# Patient Record
Sex: Female | Born: 1960 | Race: Black or African American | Hispanic: No | Marital: Single | Smoking: Never smoker
Health system: Southern US, Community
[De-identification: ages and names within clinical notes are randomized; demographics above are authoritative.]

## PROBLEM LIST (undated history)

## (undated) ENCOUNTER — Emergency Department (HOSPITAL_COMMUNITY): Admission: EM | Discharge status: Absent without leave | Payer: 59

## (undated) ENCOUNTER — Emergency Department (HOSPITAL_COMMUNITY): Admission: EM | Payer: 59 | Source: Home / Self Care

## (undated) ENCOUNTER — Emergency Department (HOSPITAL_COMMUNITY): Admission: EM | Discharge status: Against Medical Advice | Payer: Self-pay

## (undated) DIAGNOSIS — IMO0001 Reserved for inherently not codable concepts without codable children: Secondary | ICD-10-CM

## (undated) DIAGNOSIS — E785 Hyperlipidemia, unspecified: Secondary | ICD-10-CM

## (undated) DIAGNOSIS — I4891 Unspecified atrial fibrillation: Secondary | ICD-10-CM

## (undated) DIAGNOSIS — M1990 Unspecified osteoarthritis, unspecified site: Secondary | ICD-10-CM

## (undated) DIAGNOSIS — R0602 Shortness of breath: Secondary | ICD-10-CM

## (undated) DIAGNOSIS — R896 Abnormal cytological findings in specimens from other organs, systems and tissues: Secondary | ICD-10-CM

## (undated) DIAGNOSIS — R079 Chest pain, unspecified: Secondary | ICD-10-CM

## (undated) DIAGNOSIS — K635 Polyp of colon: Secondary | ICD-10-CM

## (undated) DIAGNOSIS — I10 Essential (primary) hypertension: Secondary | ICD-10-CM

## (undated) HISTORY — DX: Unspecified atrial fibrillation: I48.91

## (undated) HISTORY — DX: Reserved for inherently not codable concepts without codable children: IMO0001

## (undated) HISTORY — PX: TONSILLECTOMY: SUR1361

## (undated) HISTORY — DX: Polyp of colon: K63.5

## (undated) HISTORY — PX: TUBAL LIGATION: SHX77

## (undated) HISTORY — DX: Abnormal cytological findings in specimens from other organs, systems and tissues: R89.6

## (undated) HISTORY — DX: Hyperlipidemia, unspecified: E78.5

---

## 1998-04-09 ENCOUNTER — Other Ambulatory Visit: Admission: RE | Admit: 1998-04-09 | Discharge: 1998-04-09 | Payer: Self-pay | Admitting: Gynecology

## 1998-05-13 ENCOUNTER — Emergency Department (HOSPITAL_COMMUNITY): Admission: EM | Admit: 1998-05-13 | Discharge: 1998-05-13 | Payer: Self-pay | Admitting: Emergency Medicine

## 1998-05-30 ENCOUNTER — Ambulatory Visit (HOSPITAL_BASED_OUTPATIENT_CLINIC_OR_DEPARTMENT_OTHER): Admission: RE | Admit: 1998-05-30 | Discharge: 1998-05-30 | Payer: Self-pay | Admitting: General Surgery

## 1998-09-09 ENCOUNTER — Other Ambulatory Visit: Admission: RE | Admit: 1998-09-09 | Discharge: 1998-09-09 | Payer: Self-pay | Admitting: Gynecology

## 1999-03-06 ENCOUNTER — Other Ambulatory Visit: Admission: RE | Admit: 1999-03-06 | Discharge: 1999-03-06 | Payer: Self-pay | Admitting: Gynecology

## 1999-08-25 ENCOUNTER — Other Ambulatory Visit: Admission: RE | Admit: 1999-08-25 | Discharge: 1999-08-25 | Payer: Self-pay | Admitting: Gynecology

## 2000-02-16 ENCOUNTER — Other Ambulatory Visit: Admission: RE | Admit: 2000-02-16 | Discharge: 2000-02-16 | Payer: Self-pay | Admitting: Gynecology

## 2000-02-18 ENCOUNTER — Encounter: Admission: RE | Admit: 2000-02-18 | Discharge: 2000-02-18 | Payer: Self-pay | Admitting: Cardiology

## 2000-02-18 ENCOUNTER — Encounter: Payer: Self-pay | Admitting: Cardiology

## 2000-05-09 ENCOUNTER — Other Ambulatory Visit: Admission: RE | Admit: 2000-05-09 | Discharge: 2000-05-09 | Payer: Self-pay | Admitting: Gynecology

## 2000-08-23 ENCOUNTER — Encounter: Payer: Self-pay | Admitting: Cardiology

## 2000-08-23 ENCOUNTER — Encounter: Admission: RE | Admit: 2000-08-23 | Discharge: 2000-08-23 | Payer: Self-pay | Admitting: Cardiology

## 2001-08-10 ENCOUNTER — Ambulatory Visit (HOSPITAL_COMMUNITY): Admission: RE | Admit: 2001-08-10 | Discharge: 2001-08-10 | Payer: Self-pay | Admitting: Cardiology

## 2001-08-10 ENCOUNTER — Encounter: Payer: Self-pay | Admitting: Cardiology

## 2001-11-08 ENCOUNTER — Other Ambulatory Visit: Admission: RE | Admit: 2001-11-08 | Discharge: 2001-11-08 | Payer: Self-pay | Admitting: Gynecology

## 2001-11-15 ENCOUNTER — Ambulatory Visit (HOSPITAL_COMMUNITY): Admission: RE | Admit: 2001-11-15 | Discharge: 2001-11-15 | Payer: Self-pay | Admitting: Cardiology

## 2001-11-21 ENCOUNTER — Ambulatory Visit (HOSPITAL_COMMUNITY): Admission: RE | Admit: 2001-11-21 | Discharge: 2001-11-21 | Payer: Self-pay | Admitting: Cardiology

## 2002-11-01 ENCOUNTER — Other Ambulatory Visit: Admission: RE | Admit: 2002-11-01 | Discharge: 2002-11-01 | Payer: Self-pay | Admitting: Gynecology

## 2003-12-26 ENCOUNTER — Ambulatory Visit (HOSPITAL_BASED_OUTPATIENT_CLINIC_OR_DEPARTMENT_OTHER): Admission: RE | Admit: 2003-12-26 | Discharge: 2003-12-26 | Payer: Self-pay | Admitting: Orthopedic Surgery

## 2003-12-26 ENCOUNTER — Encounter (INDEPENDENT_AMBULATORY_CARE_PROVIDER_SITE_OTHER): Payer: Self-pay | Admitting: *Deleted

## 2003-12-26 ENCOUNTER — Ambulatory Visit (HOSPITAL_COMMUNITY): Admission: RE | Admit: 2003-12-26 | Discharge: 2003-12-26 | Payer: Self-pay | Admitting: Orthopedic Surgery

## 2004-02-03 ENCOUNTER — Other Ambulatory Visit: Admission: RE | Admit: 2004-02-03 | Discharge: 2004-02-03 | Payer: Self-pay | Admitting: Gynecology

## 2004-05-15 ENCOUNTER — Other Ambulatory Visit: Admission: RE | Admit: 2004-05-15 | Discharge: 2004-05-15 | Payer: Self-pay | Admitting: Gynecology

## 2004-09-10 ENCOUNTER — Observation Stay (HOSPITAL_COMMUNITY): Admission: RE | Admit: 2004-09-10 | Discharge: 2004-09-11 | Payer: Self-pay | Admitting: Orthopedic Surgery

## 2005-08-03 ENCOUNTER — Other Ambulatory Visit: Admission: RE | Admit: 2005-08-03 | Discharge: 2005-08-03 | Payer: Self-pay | Admitting: Gynecology

## 2005-08-27 ENCOUNTER — Emergency Department (HOSPITAL_COMMUNITY): Admission: EM | Admit: 2005-08-27 | Discharge: 2005-08-28 | Payer: Self-pay | Admitting: Family Medicine

## 2006-10-14 ENCOUNTER — Other Ambulatory Visit: Admission: RE | Admit: 2006-10-14 | Discharge: 2006-10-14 | Payer: Self-pay | Admitting: Gynecology

## 2007-05-11 ENCOUNTER — Other Ambulatory Visit: Admission: RE | Admit: 2007-05-11 | Discharge: 2007-05-11 | Payer: Self-pay | Admitting: Gynecology

## 2007-10-09 ENCOUNTER — Emergency Department (HOSPITAL_COMMUNITY): Admission: EM | Admit: 2007-10-09 | Discharge: 2007-10-09 | Payer: Self-pay | Admitting: Emergency Medicine

## 2007-11-06 ENCOUNTER — Ambulatory Visit (HOSPITAL_COMMUNITY): Admission: RE | Admit: 2007-11-06 | Discharge: 2007-11-06 | Payer: Self-pay | Admitting: Cardiology

## 2007-11-19 HISTORY — PX: CARDIAC CATHETERIZATION: SHX172

## 2007-11-23 ENCOUNTER — Inpatient Hospital Stay (HOSPITAL_BASED_OUTPATIENT_CLINIC_OR_DEPARTMENT_OTHER): Admission: RE | Admit: 2007-11-23 | Discharge: 2007-11-23 | Payer: Self-pay | Admitting: Cardiology

## 2007-12-13 ENCOUNTER — Other Ambulatory Visit: Admission: RE | Admit: 2007-12-13 | Discharge: 2007-12-13 | Payer: Self-pay | Admitting: Gynecology

## 2008-01-23 ENCOUNTER — Encounter: Admission: RE | Admit: 2008-01-23 | Discharge: 2008-01-23 | Payer: Self-pay | Admitting: Orthopedic Surgery

## 2008-03-04 ENCOUNTER — Encounter (INDEPENDENT_AMBULATORY_CARE_PROVIDER_SITE_OTHER): Payer: Self-pay | Admitting: Specialist

## 2008-03-04 ENCOUNTER — Ambulatory Visit (HOSPITAL_BASED_OUTPATIENT_CLINIC_OR_DEPARTMENT_OTHER): Admission: RE | Admit: 2008-03-04 | Discharge: 2008-03-04 | Payer: Self-pay | Admitting: Specialist

## 2009-01-06 ENCOUNTER — Other Ambulatory Visit: Admission: RE | Admit: 2009-01-06 | Discharge: 2009-01-06 | Payer: Self-pay | Admitting: Gynecology

## 2009-01-06 ENCOUNTER — Ambulatory Visit: Payer: Self-pay | Admitting: Gynecology

## 2009-01-06 ENCOUNTER — Encounter: Payer: Self-pay | Admitting: Gynecology

## 2009-07-08 ENCOUNTER — Other Ambulatory Visit: Admission: RE | Admit: 2009-07-08 | Discharge: 2009-07-08 | Payer: Self-pay | Admitting: Gynecology

## 2009-07-08 ENCOUNTER — Encounter: Payer: Self-pay | Admitting: Gynecology

## 2009-07-08 ENCOUNTER — Ambulatory Visit: Payer: Self-pay | Admitting: Gynecology

## 2009-07-29 ENCOUNTER — Emergency Department (HOSPITAL_COMMUNITY): Admission: EM | Admit: 2009-07-29 | Discharge: 2009-07-29 | Payer: Self-pay | Admitting: Emergency Medicine

## 2009-07-30 ENCOUNTER — Emergency Department (HOSPITAL_COMMUNITY): Admission: EM | Admit: 2009-07-30 | Discharge: 2009-07-31 | Payer: Self-pay | Admitting: Emergency Medicine

## 2009-07-31 ENCOUNTER — Inpatient Hospital Stay (HOSPITAL_COMMUNITY): Admission: AD | Admit: 2009-07-31 | Discharge: 2009-08-03 | Payer: Self-pay | Admitting: Psychiatry

## 2009-07-31 ENCOUNTER — Ambulatory Visit: Payer: Self-pay | Admitting: Psychiatry

## 2010-10-09 ENCOUNTER — Ambulatory Visit
Admission: RE | Admit: 2010-10-09 | Discharge: 2010-10-09 | Payer: Self-pay | Source: Home / Self Care | Attending: Gynecology | Admitting: Gynecology

## 2010-10-09 ENCOUNTER — Other Ambulatory Visit: Payer: Self-pay | Admitting: Gynecology

## 2010-10-09 ENCOUNTER — Other Ambulatory Visit
Admission: RE | Admit: 2010-10-09 | Discharge: 2010-10-09 | Payer: Self-pay | Source: Home / Self Care | Admitting: Gynecology

## 2010-10-15 ENCOUNTER — Ambulatory Visit
Admission: RE | Admit: 2010-10-15 | Discharge: 2010-10-15 | Payer: Self-pay | Source: Home / Self Care | Attending: Gynecology | Admitting: Gynecology

## 2010-12-23 LAB — URINALYSIS, ROUTINE W REFLEX MICROSCOPIC
Glucose, UA: NEGATIVE mg/dL
Hgb urine dipstick: NEGATIVE
Ketones, ur: NEGATIVE mg/dL
Protein, ur: NEGATIVE mg/dL
pH: 5.5 (ref 5.0–8.0)

## 2010-12-23 LAB — RAPID URINE DRUG SCREEN, HOSP PERFORMED
Amphetamines: NOT DETECTED
Benzodiazepines: POSITIVE — AB
Opiates: NOT DETECTED
Tetrahydrocannabinol: NOT DETECTED

## 2010-12-23 LAB — POCT I-STAT, CHEM 8
BUN: <3 mg/dL — ABNORMAL LOW (ref 6–23)
BUN: <3 mg/dL — ABNORMAL LOW (ref 6–23)
Calcium, Ion: 1.13 mmol/L (ref 1.12–1.32)
Calcium, Ion: 1.21 mmol/L (ref 1.12–1.32)
Chloride: 105 mEq/L (ref 96–112)
Creatinine, Ser: 0.5 mg/dL (ref 0.4–1.2)
HCT: 37 % (ref 36.0–46.0)
Hemoglobin: 12.6 g/dL (ref 12.0–15.0)
Sodium: 141 mEq/L (ref 135–145)
TCO2: 24 mmol/L (ref 0–100)
TCO2: 28 mmol/L (ref 0–100)

## 2010-12-23 LAB — DIFFERENTIAL
Basophils Absolute: 0 10*3/uL (ref 0.0–0.1)
Basophils Relative: 1 % (ref 0–1)
Eosinophils Absolute: 0 10*3/uL (ref 0.0–0.7)
Eosinophils Relative: 1 % (ref 0–5)
Neutrophils Relative %: 70 % (ref 43–77)

## 2010-12-23 LAB — CBC
HCT: 35.2 % — ABNORMAL LOW (ref 36.0–46.0)
MCHC: 33.5 g/dL (ref 30.0–36.0)
MCV: 90.6 fL (ref 78.0–100.0)
Platelets: 385 10*3/uL (ref 150–400)
RDW: 15.1 % (ref 11.5–15.5)

## 2010-12-23 LAB — ETHANOL

## 2010-12-23 LAB — POCT PREGNANCY, URINE

## 2011-02-02 NOTE — Cardiovascular Report (Signed)
NAMEPETRA, [name]                ACCOUNT NO.:  [id number]   MEDICAL RECORD NO.:  [id number]          PATIENT TYPE:  OIB   LOCATION:  NA                           FACILITY:  [facility]   PHYSICIAN:  Mohan N. Sharyn Lull, M.D. DATE OF BIRTH:  07-22-1961   DATE OF PROCEDURE:  11/23/2007  DATE OF DISCHARGE:                            CARDIAC CATHETERIZATION   PROCEDURE:  Left cardiac catheterization with selective left and right  coronary angiography, left ventriculography via right groin using  Judkins technique.   INDICATIONS FOR PROCEDURE:  Elizabeth [name] is a 50 year old black female  with past medical history significant for hypertension, history of  paroxysmal A fib with rapid ventricular response.  She complains of  retrosternal chest pain described as heaviness, grade 3/10, associated  with feeling tired and weak, also complains of palpitation off and on  and was noted to be in A fib with RPR and spontaneously converted to  regular normal sinus rhythm.  The patient underwent Persantine Myoview  on November 05, 2006 which showed anterior wall ischemia with EF of 49%.  EKG done in my office showed normal sinus rhythm with ST-T wave changes  in the anterolateral and inferior leads.  The patient is referred for  left cath.   PAST MEDICAL HISTORY:  As above.   PAST SURGICAL HISTORY:  1. She had ganglion cyst resection in the past.  2. Bunionectomy in the past.   SOCIAL HISTORY:  She is single and has 3 children.  No history of  smoking.  Drinks wine socially.  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   FAMILY HISTORY:  Father died of MI at the age of 65; he had a CABG at  the age of 47.  Mother died of MI also had the age of 78; she also had  CA of jejunum.  One brother had MI at the age of 45; he is hypertensive  and diabetic.   MEDICATION AT HOME:  She is on:  1. Enteric-coated aspirin 81 mg p.o. daily.  2. Toprol-XL 50 mg p.o. daily.   ALLERGIES:  NO KNOWN DRUG  ALLERGIES.   EXAMINATION:  She was alert, awake and oriented x3 in no acute distress.  Blood pressure was 140/90.  Pulse was 88.  Conjunctivae were pink.  NECK:  Supple.  No JVD, no bruit.  LUNGS:  Clear to auscultation without  rhonchi or rales.  CARDIOVASCULAR:  S1-S2 were normal.  There was a soft  systolic murmur.  There was no S3 gallop.  ABDOMEN:  Soft.  Bowel sounds  present.  Nontender.  EXTREMITIES:  There was no clubbing, cyanosis or  edema.   IMPRESSION:  1. Chest pain, positive Persantine Myoview, rule out coronary      insufficiency.  2. Uncontrolled hypertension.  3. History of paroxysmal atrial fibrillation with rapid ventricular      response.   PLAN:  Lopressor was increased to 50 mg p.o. b.i.d., enteric-coated  aspirin 81 mg was added and Nitrolingual spray p.r.n. I discussed with  the patient regarding left cath, its risks and benefits,  i.e. death, MI,  stroke, local vascular complications, etc, and consented for the  procedure.   PROCEDURE:  After obtaining the informed consent, the patient was  brought to the cath lab and was placed on fluoroscopic table.  The right  groin was prepped and draped in the usual fashion.  Xylocaine 1% was  used for local anesthesia in the right groin.  With the help of a thin-  wall needle, a 4-French arterial sheath was placed.  The sheath was  aspirated and flushed.  Next, a 4-French left Judkins catheter was  advanced over the wire under fluoroscopic guidance up to the ascending  aorta.  Wire was pulled out and the catheter was aspirated and connected  to the manifold.  Catheter was further advanced and engaged into left  coronary ostium.  Multiple views of the left system were taken.  Next,  the catheter was disengaged and was pulled out over the wire and was  replaced with 4-French 3-D right diagnostic catheter, which was advanced  over the wire under fluoroscopic guidance up to the ascending aorta.  Wire was pulled out.  The  catheter was aspirated and connected to the  manifold.  Next, this catheter was attempted to engage into right  coronary ostium without success.  This catheter was replaced with right  Judkins catheter, which also could not be engaged into right coronary  ostium.  Next, right Judkins catheter was pulled out over the wire and  was replaced with AL-1 4-French diagnostic catheter, which was advanced  over the wire under fluoroscopic guidance up the ascending aorta.  Wire  was pulled out.  The catheter was aspirated and connected to the  manifold.  Catheter was further advanced and engaged into right coronary  ostium.  Multiple views of the right system were taken.  Next, the  catheter was disengaged and was pulled out over the wire and was  replaced with a 4-French pigtail catheter, which was advanced over the  wire under fluoroscopic guidance up to the ascending aorta.  Wire was  pulled out and the catheter was aspirated and connected to the manifold.  The catheter was further advanced across aortic valve into the LV.  LV  pressures were recorded.  Next, left ventriculography was done in 30-  degree RAO position.  Post angiographic pressures were recorded from LV  and then pullback pressures were recorded from the aorta.  There was no  gradient across the aortic valve.  Next, the pigtail catheter was pulled  out over the wire.  Sheaths were aspirated and flushed.   FINDINGS:  LV showed good LV systolic function, EF of 55% to 60%.  Left  main was patent.  LAD has 10% to 15% proximal stenosis and 15% to 20%  mid stenosis.  Diagonal #1 and #2 were very small, which were patent.  Ramus was very, very small, which was patent.  Left circumflex was  patent.  OM-1 was very, very small.  OM-2 and OM-3 were very, very  small, which were patent.  RCA was arising from the left coronary cusp  and is patent.  The patient tolerated the procedure well.  There were no  complications.  The patient was  transferred to recovery room in stable  condition.      Elizabeth [name]. Sharyn Lull, M.D.  Electronically Signed     MNH/MEDQ  D:  11/23/2007  T:  11/23/2007  Job:  [id number]   cc:   [facility]  Kevin Fenton  Leeann Must, M.D.

## 2011-02-02 NOTE — Op Note (Signed)
Elizabeth [name], Elizabeth [name]                ACCOUNT NO.:  [id number]   MEDICAL RECORD NO.:  [id number]          PATIENT TYPE:  AMB   LOCATION:  DSC                          FACILITY:  [facility]   PHYSICIAN:  Earvin Hansen L. Shon Hough, M.D.DATE OF BIRTH:  03-26-1961   DATE OF PROCEDURE:  03/04/2008  DATE OF DISCHARGE:                               OPERATIVE REPORT   INDICATIONS:  A 50 year old lady with an expanding mass about her left  lateral anterior thigh.  It measured approximately 6 x 8 inches with  increase growth.   PROCEDURES DONE:  Excision of the area deep, excision with liposuction  assistance.   ANESTHESIA:  General.   DESCRIPTION OF PROCEDURE:  The patient underwent general anesthesia,  after a drawing was made on the mass while the patient was standing,  prep was done to the left thigh and leg with Hibiclens solution and  soap, walled off with sterile towels and drapes so as to make a sterile  field.  A 0.4% Xylocaine with epinephrine 1:400,000 concentration was  injected for vasoconstriction with 100 mL.  Next, small incision was  made and liposuction was fashioned over the mass and it seemed to be  deeped out to underlying fascia.  We have to dissect under the area with  removal of 250 mL of material, which had consistency of lipoma.  This  mass was sent as specimen off; however, they ruled out any liposarcoma  of the mass effects.  Prior to hemostasis, sterile wraps were applied, 4  x 4s, ABDs, Kerlix, and ace wraps.  She tolerated the procedures very  well and was taken to recovery room in good condition.      Elizabeth [name]. Shon Hough, M.D.  Electronically Signed     GLT/MEDQ  D:  03/04/2008  T:  03/05/2008  Job:  [id number]

## 2011-02-05 NOTE — Op Note (Signed)
Elizabeth [name], Elizabeth [name]                ACCOUNT NO.:  [id number]   MEDICAL RECORD NO.:  [id number]          PATIENT TYPE:  AMB   LOCATION:  DAY                          FACILITY:  [facility]   PHYSICIAN:  Marlowe Kays, M.D.  DATE OF BIRTH:  04/01/1961   DATE OF PROCEDURE:  09/10/2004  DATE OF DISCHARGE:                                 OPERATIVE REPORT   PREOPERATIVE DIAGNOSIS:  Bilateral painful bunions.   POSTOPERATIVE DIAGNOSIS:  Bilateral painful bunions.   OPERATION:  Bilateral simple bunionectomies.   SURGEON:  Marlowe Kays, M.D.   ASSISTANT:  Nurse.   ANESTHESIA:  General.   INDICATIONS FOR PROCEDURE:  She has painful bunions bilaterally with a 10  degree first and second metatarsal angle.  On the dorsum of the right first  metatarsal head, she had some large spurs in addition.   PROCEDURE:  Satisfactory general anesthesia.  Bilateral pneumatic  tourniquets.  Legs were esmarched out nonsterilely and prepped from the calf  to the toes with Duraprep.  Draped in a sterile field.  On the left foot, I  made a dorsomedial incision, protecting the dorsal sensory nerve.  The  capsule was identified and opened with a flap-based distally technique.  The  dorsum and first metatarsal head did not have any spurs.  I did a generous  bunionectomy by making a cut at the base with a 3/4 inch curved osteotome  and then going retrograde, smoothing out  the rough edges with the rongeur.  I felt the bunionectomy had been completed.  I irrigated the wound well,  infiltrated the wound edges with 0.5% plain Marcaine and with the great toe  in correct position, reattached the flap under tension with interrupted 0  Vicryl and the skin and subcutaneous tissue with interrupted 4-0 nylon mast  sutures.  Betadine and Adaptic dry sterile dressing was applied.  The  tourniquet was released.  We then did exactly the same procedure on the  right foot, except that I removed the dorsal spurs with a small  Baer  rongeur.  The tourniquet was released after completion of the right foot on  the right lower extremity.  She tolerated the procedure well, and at the  time of this dictation was on her way to the recovery room in satisfactory  with no known complications.      JA/MEDQ  D:  09/10/2004  T:  09/10/2004  Job:  [id number]

## 2011-02-05 NOTE — Op Note (Signed)
Elizabeth [name], Elizabeth [name]                          ACCOUNT NO.:  [id number]   MEDICAL RECORD NO.:  [id number]                   PATIENT TYPE:  AMB   LOCATION:  DSC                                  FACILITY:  [facility]   PHYSICIAN:  Marlowe Kays, M.D.               DATE OF BIRTH:  05/04/1961   DATE OF PROCEDURE:  12/26/2003  DATE OF DISCHARGE:                                 OPERATIVE REPORT   PREOPERATIVE DIAGNOSIS:  Symptomatic ganglion, dorsum of right wrist.   POSTOPERATIVE DIAGNOSIS:  Symptomatic ganglion, dorsum of right wrist.   OPERATION:  Excision of ganglion, dorsum of right wrist.   SURGEON:  Marlowe Kays, M.D.   ASSISTANT:  Nurse.   ANESTHESIA:  IV regional.   PATHOLOGY AND JUSTIFICATION FOR PROCEDURE:  The mass has been present for  some four months.  She is having weakness in her hand because of this and  pain going up the arm.  On exam this ganglion measures a good 2 cm in  diameter, is in the mid-dorsum of her right wrist.  X-rays have been normal.   PROCEDURE:  Satisfactory IV regional anesthesia, Duraprep from midforearm to  fingertips, was draped in a sterile field.  A transverse incision was made  and going through the skin and subcutaneous tissue, the large ganglion was  noted.  The thin extensor retinaculum on top of it was opened in line with  the extensor tendons and the mass demarcated.  The extensor tendons were  protected, and I separated out the mass from these tendons, going down to  the capsule of the wrist joint.  This encompassed a large section of the  capsule.  I did excise it in toto.  Potential bleeders were coagulated with  bipolar cautery.  I then tried to repair the defect in the dorsum of the  wrist capsule but was unable to completely close it.  There was sufficient  tissue distally but not enough proximally.  Consequently, I performed a  partial closure and covered the defect with Gelfoam.  With the wrist in  slight dorsiflexion, I then  closed the extensor retinaculum with interrupted  3-0 Vicryl, which I had used for the partial capsule closure as well.  I  also closed the subcutaneous tissue with the same and the skin with running  4-0 nylon.  Betadine, Adaptic dry sterile dressing, volar plaster splint  were applied.  The tourniquet was released.  At the time of this dictation  she was on her way to the recovery room in satisfactory condition with no  known complications.                                               Marlowe Kays, M.D.    JA/MEDQ  D:  12/26/2003  T:  12/26/2003  Job:  [id number]

## 2011-06-10 LAB — I-STAT 8, (EC8 V) (CONVERTED LAB)
Acid-Base Excess: 1
BUN: 8
Chloride: 109
Potassium: 4
pH, Ven: 7.43 — ABNORMAL HIGH

## 2011-06-10 LAB — TSH: TSH: 1.725

## 2011-06-10 LAB — POCT CARDIAC MARKERS: Troponin i, poc: <0.05

## 2011-06-17 LAB — POCT HEMOGLOBIN-HEMACUE: Hemoglobin: 13.2

## 2011-10-25 ENCOUNTER — Encounter: Payer: Self-pay | Admitting: Gynecology

## 2011-10-25 ENCOUNTER — Encounter: Payer: Self-pay | Admitting: *Deleted

## 2011-10-25 DIAGNOSIS — IMO0001 Reserved for inherently not codable concepts without codable children: Secondary | ICD-10-CM | POA: Insufficient documentation

## 2011-10-25 DIAGNOSIS — I4891 Unspecified atrial fibrillation: Secondary | ICD-10-CM | POA: Insufficient documentation

## 2011-10-27 ENCOUNTER — Encounter: Payer: Self-pay | Admitting: Gynecology

## 2011-10-27 ENCOUNTER — Ambulatory Visit (INDEPENDENT_AMBULATORY_CARE_PROVIDER_SITE_OTHER): Payer: Managed Care, Other (non HMO) | Admitting: Gynecology

## 2011-10-27 ENCOUNTER — Other Ambulatory Visit (HOSPITAL_COMMUNITY)
Admission: RE | Admit: 2011-10-27 | Discharge: 2011-10-27 | Disposition: A | Discharge status: Home / Self Care | Payer: Managed Care, Other (non HMO) | Source: Ambulatory Visit | Attending: Gynecology | Admitting: Gynecology

## 2011-10-27 VITALS — BP 134/80 | Ht 65.0 in | Wt 206.0 lb

## 2011-10-27 DIAGNOSIS — Z13220 Encounter for screening for lipoid disorders: Secondary | ICD-10-CM

## 2011-10-27 DIAGNOSIS — K635 Polyp of colon: Secondary | ICD-10-CM | POA: Insufficient documentation

## 2011-10-27 DIAGNOSIS — N926 Irregular menstruation, unspecified: Secondary | ICD-10-CM

## 2011-10-27 DIAGNOSIS — Z01419 Encounter for gynecological examination (general) (routine) without abnormal findings: Secondary | ICD-10-CM

## 2011-10-27 DIAGNOSIS — Z131 Encounter for screening for diabetes mellitus: Secondary | ICD-10-CM

## 2011-10-27 LAB — URINALYSIS W MICROSCOPIC + REFLEX CULTURE
Glucose, UA: NEGATIVE mg/dL
Leukocytes, UA: NEGATIVE
pH: 5 (ref 5.0–8.0)

## 2011-10-27 NOTE — Progress Notes (Signed)
Elizabeth [name] Jun 21, 1961 [id number]        51 y.o.  for annual exam.  Overall doing well. This note was past year she has started to drop out menses where she'll skip a month. No other significant menopausal symptoms such as hot flashes sweats.  Past medical history,surgical history, medications, allergies, family history and social history were all reviewed and documented in the EPIC chart. ROS:  Was performed and pertinent positives and negatives are included in the history.  Exam: Kim chaperone present Filed Vitals:   10/27/11 1550  BP: 134/80   General appearance  Normal Skin grossly normal Head/Neck normal with no cervical or supraclavicular adenopathy thyroid normal Lungs  clear Cardiac RR, without RMG Abdominal  soft, nontender, without masses, organomegaly or hernia Breasts  examined lying and sitting without masses, retractions, discharge or axillary adenopathy. Pelvic  Ext/BUS/vagina  normal   Cervix  normal  Pap done  Uterus  anteverted, normal size, shape and contour, midline and mobile nontender   Adnexa  Without masses or tenderness    Anus and perineum  normal   Rectovaginal  normal sphincter tone without palpated masses or tenderness.    Assessment/Plan:  51 y.o. female for annual exam.    1. History ASCUS negative high-risk HPV 2010. Pap was done today. 2 subsequent Pap smears were normal with the last one in 2012. 2. Irregular menses. Certainly goes with her age. When check baseline FSH now. Will keep menstrual calendar as long as less frequent but normal when they come we'll follow if prolonged or atypical bleeding she does report this for further evaluation. 3. Mammogram. Patient had her mammogram yesterday and it was normal. We'll continue exam mammography. SBE monthly reviewed. 4. Colonoscopy. She had a colonoscopy 5 years ago where they found polyps I recommended she call them as I suspect they will do one this year and she'll make arrangements for  that. 5. Health maintenance. She asked for referrals for primary as her primary is retiring. Recommended she call [facility] and establish care with them. She is known to have elevated cholesterol which has not been treated and she is followed for this as well as her low level blood pressure and she agrees to call and schedule.  She has not had lab work done recently I went ahead and ordered a CBC lipid profile glucose and urinalysis along with her FSH. Assuming she continues well from a gynecologic standpoint then she'll see [place] in a year sooner as needed.    Dara Lords MD, 4:20 PM 10/27/2011

## 2011-10-27 NOTE — Patient Instructions (Signed)
Follow up for hormone level and at work. Schedule a follow up annual exam with internal medicine.

## 2011-10-28 LAB — CBC WITH DIFFERENTIAL/PLATELET
Basophils Absolute: 0 10*3/uL (ref 0.0–0.1)
Basophils Relative: 0 % (ref 0–1)
Hemoglobin: 12 g/dL (ref 12.0–15.0)
MCHC: 31.2 g/dL (ref 30.0–36.0)
Monocytes Relative: 7 % (ref 3–12)
Neutro Abs: 4.4 10*3/uL (ref 1.7–7.7)
Neutrophils Relative %: 67 % (ref 43–77)
RDW: 14.8 % (ref 11.5–15.5)
WBC: 6.5 10*3/uL (ref 4.0–10.5)

## 2011-10-28 LAB — FOLLICLE STIMULATING HORMONE: FSH: 51.9 m[IU]/mL

## 2011-10-28 LAB — GLUCOSE, RANDOM: Glucose, Bld: 65 mg/dL — ABNORMAL LOW (ref 70–99)

## 2012-05-21 ENCOUNTER — Ambulatory Visit (INDEPENDENT_AMBULATORY_CARE_PROVIDER_SITE_OTHER): Payer: Managed Care, Other (non HMO) | Admitting: Family Medicine

## 2012-05-21 VITALS — BP 173/102 | HR 73 | Temp 98.1°F | Resp 18

## 2012-05-21 DIAGNOSIS — R109 Unspecified abdominal pain: Secondary | ICD-10-CM

## 2012-05-21 LAB — POCT CBC
Granulocyte percent: 74 %G (ref 37–80)
HCT, POC: 42.1 % (ref 37.7–47.9)
Hemoglobin: 12.4 g/dL (ref 12.2–16.2)
Lymph, poc: 1.2 (ref 0.6–3.4)
MCH, POC: 27.9 pg (ref 27–31.2)
MCHC: 29.5 g/dL — AB (ref 31.8–35.4)
MCV: 94.8 fL (ref 80–97)
MID (cbc): 0.4 (ref 0–0.9)
MPV: 9.6 fL (ref 0–99.8)
POC Granulocyte: 4.6 (ref 2–6.9)
POC LYMPH PERCENT: 19.5 %L (ref 10–50)
POC MID %: 6.5 %M (ref 0–12)
Platelet Count, POC: 363 10*3/uL (ref 142–424)
RBC: 4.44 M/uL (ref 4.04–5.48)
RDW, POC: 16.2 %
WBC: 6.2 10*3/uL (ref 4.6–10.2)

## 2012-05-21 LAB — GLUCOSE, POCT (MANUAL RESULT ENTRY): POC Glucose: 88 mg/dl (ref 70–99)

## 2012-05-21 MED ORDER — CIPROFLOXACIN HCL 500 MG PO TABS: 500.0000 mg | ORAL_TABLET | Freq: Two times a day (BID) | ORAL | Status: AC

## 2012-05-21 MED ORDER — METRONIDAZOLE 500 MG PO TABS: 500.0000 mg | ORAL_TABLET | Freq: Two times a day (BID) | ORAL | Status: AC

## 2012-05-21 MED ORDER — TRAMADOL HCL 50 MG PO TABS: 50.0000 mg | ORAL_TABLET | Freq: Three times a day (TID) | ORAL | Status: AC | PRN

## 2012-05-21 NOTE — Progress Notes (Signed)
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 She has a history of diverticulitis.  Patient had no nausea or vomiting but the pain she describes been excruciating at times even though she's been able to continue working. It kept her awake some of the time.  She has a family history of heart disease (both parents died of a heart attack) and stroke (sister recently had CVA)  Patient denies sore throat, fever, shortness of breath, dysuria, kidney stone history, flank pain.  Objective: No acute distress the patient seems worried and holding her left side HEENT: Unremarkable: Neck: Supple no adenopathy Chest: Clear Heart: Regular no murmur gallop or rub Abdomen: Mildly tender with deep palpation left side, no rebound, no HSM or masses EKG:  Nonspecific T wave inversions in V1 through V3 Results for orders placed in visit on 05/21/12  POCT CBC      Component Value Range   WBC 6.2  4.6 - 10.2 K/uL   Lymph, poc 1.2  0.6 - 3.4   POC LYMPH PERCENT 19.5  10 - 50 %L   MID (cbc) 0.4  0 - 0.9   POC MID % 6.5  0 - 12 %M   POC Granulocyte 4.6  2 - 6.9   Granulocyte percent 74.0  37 - 80 %G   RBC 4.44  4.04 - 5.48 M/uL   Hemoglobin 12.4  12.2 - 16.2 g/dL   HCT, POC 78.2  95.6 - 47.9 %   MCV 94.8  80 - 97 fL   MCH, POC 27.9  27 - 31.2 pg   MCHC 29.5 (*) 31.8 - 35.4 g/dL   RDW, POC 21.3     Platelet Count, POC 363  142 - 424 K/uL   MPV 9.6  0 - 99.8 fL  GLUCOSE, POCT (MANUAL RESULT ENTRY)      Component Value Range   POC Glucose 88  70 - 99 mg/dl     Assessment:  Acute gastroenteritis vs. Diverticulitis  Plan:   Cipro, flagyl, tramadol, oow tomorrow  Recheck tomorrow unless feeling fine

## 2012-05-21 NOTE — Addendum Note (Signed)
Addended by: Gerrianne Scale on: 05/21/2012 05:48 PM   Modules accepted: Orders

## 2012-05-23 ENCOUNTER — Encounter: Payer: Self-pay | Admitting: Family Medicine

## 2012-05-24 ENCOUNTER — Observation Stay (HOSPITAL_COMMUNITY)
Admission: EM | Admit: 2012-05-24 | Discharge: 2012-05-25 | Disposition: A | Discharge status: Home / Self Care | Payer: Managed Care, Other (non HMO) | Attending: Emergency Medicine | Admitting: Emergency Medicine

## 2012-05-24 ENCOUNTER — Observation Stay (HOSPITAL_COMMUNITY): Payer: Managed Care, Other (non HMO)

## 2012-05-24 ENCOUNTER — Encounter (HOSPITAL_COMMUNITY): Payer: Self-pay | Admitting: Family Medicine

## 2012-05-24 DIAGNOSIS — I10 Essential (primary) hypertension: Secondary | ICD-10-CM | POA: Insufficient documentation

## 2012-05-24 DIAGNOSIS — M25519 Pain in unspecified shoulder: Secondary | ICD-10-CM | POA: Insufficient documentation

## 2012-05-24 DIAGNOSIS — Z8249 Family history of ischemic heart disease and other diseases of the circulatory system: Secondary | ICD-10-CM | POA: Insufficient documentation

## 2012-05-24 DIAGNOSIS — R079 Chest pain, unspecified: Principal | ICD-10-CM | POA: Insufficient documentation

## 2012-05-24 LAB — COMPREHENSIVE METABOLIC PANEL
ALT: 31 U/L (ref 0–35)
AST: 23 U/L (ref 0–37)
Albumin: 3.8 g/dL (ref 3.5–5.2)
Alkaline Phosphatase: 80 U/L (ref 39–117)
BUN: 10 mg/dL (ref 6–23)
CO2: 27 mEq/L (ref 19–32)
Calcium: 9.5 mg/dL (ref 8.4–10.5)
Chloride: 101 mEq/L (ref 96–112)
Creatinine, Ser: 0.8 mg/dL (ref 0.50–1.10)
GFR calc Af Amer: >90 mL/min (ref >90)
GFR calc non Af Amer: 85 mL/min — ABNORMAL LOW (ref >90)
Glucose, Bld: 87 mg/dL (ref 70–99)
Potassium: 4.1 mEq/L (ref 3.5–5.1)
Sodium: 137 mEq/L (ref 135–145)
Total Bilirubin: 0.2 mg/dL — ABNORMAL LOW (ref 0.3–1.2)
Total Protein: 7.4 g/dL (ref 6.0–8.3)

## 2012-05-24 LAB — CBC WITH DIFFERENTIAL/PLATELET
Basophils Absolute: 0.1 10*3/uL (ref 0.0–0.1)
Basophils Relative: 1 % (ref 0–1)
Eosinophils Absolute: 0.1 10*3/uL (ref 0.0–0.7)
Eosinophils Relative: 1 % (ref 0–5)
HCT: 37.2 % (ref 36.0–46.0)
Hemoglobin: 11.8 g/dL — ABNORMAL LOW (ref 12.0–15.0)
Lymphocytes Relative: 32 % (ref 12–46)
Lymphs Abs: 2.3 10*3/uL (ref 0.7–4.0)
MCH: 29.4 pg (ref 26.0–34.0)
MCHC: 31.7 g/dL (ref 30.0–36.0)
MCV: 92.5 fL (ref 78.0–100.0)
Monocytes Absolute: 0.4 10*3/uL (ref 0.1–1.0)
Monocytes Relative: 5 % (ref 3–12)
Neutro Abs: 4.2 10*3/uL (ref 1.7–7.7)
Neutrophils Relative %: 61 % (ref 43–77)
Platelets: 330 10*3/uL (ref 150–400)
RBC: 4.02 MIL/uL (ref 3.87–5.11)
RDW: 14.5 % (ref 11.5–15.5)
WBC: 7.1 10*3/uL (ref 4.0–10.5)

## 2012-05-24 LAB — LIPASE, BLOOD: Lipase: 31 U/L (ref 11–59)

## 2012-05-24 LAB — POCT I-STAT TROPONIN I
Troponin i, poc: 0 ng/mL (ref 0.00–0.08)
Troponin i, poc: 0 ng/mL (ref 0.00–0.08)

## 2012-05-24 MED ORDER — SODIUM CHLORIDE 0.9 % IV BOLUS (SEPSIS)
1000.0000 mL | INTRAVENOUS | Status: AC
  Administered 2012-05-24: 1000 mL via INTRAVENOUS

## 2012-05-24 MED ORDER — ASPIRIN 325 MG PO TABS
325.0000 mg | ORAL_TABLET | Freq: Once | ORAL | Status: AC
  Administered 2012-05-24: 325 mg via ORAL
  Filled 2012-05-24: qty 1

## 2012-05-24 MED ORDER — MORPHINE SULFATE 4 MG/ML IJ SOLN
4.0000 mg | Freq: Once | INTRAMUSCULAR | Status: AC
  Administered 2012-05-24: 4 mg via INTRAVENOUS
  Filled 2012-05-24: qty 1

## 2012-05-24 MED ORDER — ONDANSETRON HCL 4 MG/2ML IJ SOLN
4.0000 mg | Freq: Once | INTRAMUSCULAR | Status: AC
  Administered 2012-05-24: 4 mg via INTRAVENOUS
  Filled 2012-05-24: qty 2

## 2012-05-24 MED ORDER — SODIUM CHLORIDE 0.9 % IV SOLN: 20.0000 mL | INTRAVENOUS | Status: DC

## 2012-05-24 MED ORDER — ONDANSETRON HCL 4 MG/2ML IJ SOLN: 4.0000 mg | Freq: Once | INTRAMUSCULAR | Status: DC

## 2012-05-24 NOTE — ED Notes (Signed)
Per pt sts 3 days of intermittent chest pain that is mid sternal and radiates into her shoulder. sts also abdominal pain due to her diverticulosis. sts was started on cipro and flagyl and started taking Monday but stopped because it was making her sick.

## 2012-05-24 NOTE — ED Provider Notes (Signed)
Patient moved to CDU under chest pain protocol. Patient resting comfortably at present with intermittent return of chest pain. Lungs CTA bilaterally. S1/S2, RRR, no murmur. Abdomen soft, bowel sounds present. Strong distal pulses palpated all extremities. Sinus rhythm on monitor without ectopy. Troponin negative x1, 12 lead reviewed, no indication of ischemia. Patient scheduled for coronary CT in AM. Diagnostic and treatment plan discussed with patient.  She states understanding and agreement.  Repeat troponin ordered.  Redose of morphine to help control pain.  8:09 PM       Pain resolved; patient now complaining of nausea. We'll redose Zofran. 11:15 PM   Nausea resolved  Dierdre Forth, PA-C 05/24/12 2345

## 2012-05-24 NOTE — ED Provider Notes (Signed)
History     CSN: [id number]  Arrival date & time 05/24/12  1516   None     Chief Complaint  Patient presents with  . Chest Pain    (Consider location/radiation/quality/duration/timing/severity/associated sxs/prior treatment) Patient is a 51 y.o. female presenting with chest pain. The history is provided by the patient.  Chest Pain Episode onset: 3 days ago. Duration of episode(s) is 3 days. Chest pain occurs intermittently. The chest pain is unchanged. Associated with: unknown. At its most intense, the pain is at 5/10. The pain is currently at 4/10. The severity of the pain is mild. The quality of the pain is described as sharp. Radiates to: shoulders. Exacerbated by: nothing. Pertinent negatives for primary symptoms include no fever, no fatigue, no shortness of breath, no cough, no abdominal pain, no nausea, no vomiting and no dizziness. She tried nothing for the symptoms. Risk factors: FH, HTN.     Past Medical History  Diagnosis Date  . Atrial fibrillation   . ASCUS (atypical squamous cells of undetermined significance) on Pap smear 99,04, 4/10  . Colon polyps     Past Surgical History  Procedure Date  . Tubal ligation   . Cardiac catheterization 3/09    Family History  Problem Relation Age of Onset  . Hypertension Mother   . Diabetes Mother   . Heart disease Mother   . Heart disease Father   . Cancer Father     stomach  . Heart disease Maternal Grandmother     History  Substance Use Topics  . Smoking status: Never Smoker   . Smokeless tobacco: Never Used  . Alcohol Use: Yes     rare    OB History    Grav Para Term Preterm Abortions TAB SAB Ect Mult Living   4 3 3  1  1   3       Review of Systems  Constitutional: Negative for fever and fatigue.  HENT: Negative for congestion, drooling and neck pain.   Eyes: Negative for pain.  Respiratory: Negative for cough and shortness of breath.   Cardiovascular: Positive for chest pain.  Gastrointestinal:  Negative for nausea, vomiting, abdominal pain and diarrhea.  Genitourinary: Negative for dysuria and hematuria.  Musculoskeletal: Negative for back pain and gait problem.  Skin: Negative for color change.  Neurological: Negative for dizziness and headaches.  Hematological: Negative for adenopathy.  Psychiatric/Behavioral: Negative for behavioral problems.  All other systems reviewed and are negative.    Allergies  Review of patient's allergies indicates no known allergies.  Home Medications   Current Outpatient Rx  Name Route Sig Dispense Refill  . CIPROFLOXACIN HCL 500 MG PO TABS Oral Take 1 tablet (500 mg total) by mouth 2 (two) times daily. 20 tablet 0  . METRONIDAZOLE 500 MG PO TABS Oral Take 1 tablet (500 mg total) by mouth 2 (two) times daily. 14 tablet 0  . TRAMADOL HCL 50 MG PO TABS Oral Take 1 tablet (50 mg total) by mouth every 8 (eight) hours as needed for pain. 30 tablet 0    BP 171/83  Pulse 67  Temp 98.3 F (36.8 C) (Oral)  Resp 18  SpO2 100%  LMP 05/12/2012  Physical Exam  Nursing note and vitals reviewed. Constitutional: She is oriented to person, place, and time. She appears well-developed and well-nourished.  HENT:  Head: Normocephalic.  Mouth/Throat: No oropharyngeal exudate.  Eyes: Conjunctivae and EOM are normal. Pupils are equal, round, and reactive to light.  Neck: Normal  range of motion. Neck supple.  Cardiovascular: Normal rate, regular rhythm, normal heart sounds and intact distal pulses.  Exam reveals no gallop and no friction rub.   No murmur heard. Pulmonary/Chest: Effort normal and breath sounds normal. No respiratory distress. She has no wheezes.  Abdominal: Soft. Bowel sounds are normal. There is no tenderness. There is no rebound and no guarding.  Musculoskeletal: Normal range of motion. She exhibits no edema and no tenderness.  Neurological: She is alert and oriented to person, place, and time.  Skin: Skin is warm and dry.  Psychiatric:  She has a normal mood and affect. Her behavior is normal.    ED Course  Procedures (including critical care time)  Labs Reviewed  CBC WITH DIFFERENTIAL - Abnormal; Notable for the following:    Hemoglobin 11.8 (*)     All other components within normal limits  COMPREHENSIVE METABOLIC PANEL - Abnormal; Notable for the following:    Total Bilirubin 0.2 (*)     GFR calc non Af Amer 85 (*)     All other components within normal limits  LIPASE, BLOOD  POCT I-STAT TROPONIN I  POCT I-STAT TROPONIN I   Dg Chest 2 View  05/24/2012  *RADIOLOGY REPORT*  Clinical Data: Chest pain  CHEST - 2 VIEW  Comparison: 10/09/2007  Findings: Lungs are clear.  Negative for pneumonia.  Negative for heart failure or effusion.  No mass lesion.  Heart size upper normal.  IMPRESSION: No acute abnormality.   Original Report Authenticated By: Camelia Phenes, M.D.      1. Chest pain      Date: 05/24/2012  Rate: 62  Rhythm: normal sinus rhythm  QRS Axis: normal  Intervals: normal  ST/T Wave abnormalities: nonspecific T wave changes  Conduction Disutrbances:none  Narrative Interpretation: Flipped t wave in V3 now upright, V1-V2 t waves remain inverted. No new ecg changes cw ischemia.   Old EKG Reviewed: changes noted    MDM  12:39 AM 51 y.o. female w hx of HTN, paroxysmal afib who pw chest pain. Pt had cath in 2009 which showed that LAD has 10% to 15% proximal stenosis and 15% to 20% mid stenosis. Pt states she has had intermittent sharp fleeting cp's lasting seconds to minutes for 3 days cw cp she had in 2009. Pt AFVSS here, denies sob, TIMI 0, pain very atypical. Pt also seen 3 days ago for abd cramping/diarrhea, has hx of diverticulitis, was started on cipro/flagyl, but has not been taking. Abd exam benign here, pt states sx resolving. Will get labs, pain control.   Will place in CDU low risk cp obs. BMI is 32, will plan for CT Coronary.   Clinical Impression 1. Chest pain            Purvis Sheffield, MD 05/25/12 [phone]

## 2012-05-25 ENCOUNTER — Observation Stay (HOSPITAL_COMMUNITY): Payer: Managed Care, Other (non HMO)

## 2012-05-25 MED ORDER — IOHEXOL 350 MG/ML SOLN
80.0000 mL | Freq: Once | INTRAVENOUS | Status: AC | PRN
  Administered 2012-05-25: 80 mL via INTRAVENOUS

## 2012-05-25 MED ORDER — NITROGLYCERIN 0.4 MG SL SUBL
SUBLINGUAL_TABLET | SUBLINGUAL | Status: AC
  Administered 2012-05-25: 0.4 mg via SUBLINGUAL
  Filled 2012-05-25: qty 25

## 2012-05-25 MED ORDER — ACETAMINOPHEN 325 MG PO TABS
650.0000 mg | ORAL_TABLET | Freq: Once | ORAL | Status: AC
  Administered 2012-05-25: 650 mg via ORAL
  Filled 2012-05-25: qty 2

## 2012-05-25 MED ORDER — METOPROLOL TARTRATE 1 MG/ML IV SOLN
INTRAVENOUS | Status: AC
  Administered 2012-05-25: 2.5 mg via INTRAVENOUS
  Filled 2012-05-25: qty 10

## 2012-05-25 MED ORDER — METOPROLOL TARTRATE 1 MG/ML IV SOLN
2.5000 mg | Freq: Once | INTRAVENOUS | Status: AC
  Administered 2012-05-25: 2.5 mg via INTRAVENOUS

## 2012-05-25 MED ORDER — METOPROLOL TARTRATE 25 MG PO TABS
50.0000 mg | ORAL_TABLET | Freq: Once | ORAL | Status: AC
  Administered 2012-05-25: 50 mg via ORAL
  Filled 2012-05-25: qty 2

## 2012-05-25 MED ORDER — NITROGLYCERIN 0.4 MG SL SUBL
0.4000 mg | SUBLINGUAL_TABLET | Freq: Once | SUBLINGUAL | Status: AC
  Administered 2012-05-25: 0.4 mg via SUBLINGUAL

## 2012-05-25 MED ORDER — MORPHINE SULFATE 4 MG/ML IJ SOLN: 4.0000 mg | Freq: Once | INTRAMUSCULAR | Status: DC

## 2012-05-25 NOTE — ED Notes (Signed)
Spoke with megan in ct. She advises will be approx 30 min and they will be ready for pt

## 2012-05-25 NOTE — ED Notes (Signed)
Patient has returned from ct. Tolerated well.

## 2012-05-25 NOTE — ED Notes (Signed)
PT BMI IS 35.2, KELLY IN CT CALLED AND MADE AWARE AND WILL SPEAK WITH RADIOLOGIST TO SEE IF PT CAN BE QUALIFIED

## 2012-05-25 NOTE — ED Provider Notes (Signed)
I was available throughout the clinical decision unit portion of the patient's care  Gerhard Munch, MD 05/25/12 1626

## 2012-05-25 NOTE — ED Notes (Signed)
PA HAS BEEN IN TO DISCUSS RESULTS WITH PT.

## 2012-05-25 NOTE — ED Provider Notes (Signed)
Pt under CP protocol.  Coronary CT in the AM.  Morning ECG reviewed, remained unchanged.  However, pt's BMI is 35.2.  She may not meet criteria for coronary CT.  Pt did not received her beta blocker this AM, she may qualifies for cardiac stress test.  Pt able to ambulate and can perform stress test.    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  Recommend consulting with cardiology.    1:40 PM Pt has a negative cath a few years ago.  She has unchanged ECG.  I have consulted with Dr. Sharyn Lull, who recommend pt to f/u in his office tomorrow or in the next few days for further management.  Pt acknowledge and agrees with plan.    Fayrene Helper, PA-C 05/25/12 1350

## 2012-05-25 NOTE — Progress Notes (Signed)
Observation review is complete. 

## 2012-06-01 NOTE — ED Provider Notes (Signed)
Medical screening examination/treatment/procedure(s) were performed by non-physician practitioner and as supervising physician I was immediately available for consultation/collaboration.  Raeford Razor, MD 06/01/12 [phone]

## 2012-06-01 NOTE — ED Provider Notes (Signed)
I saw and evaluated the patient, reviewed the resident's note and I agree with the findings and plan.  Reviewed the patient's EKG.  51 year old female with chest pain. Atypical for ACS given very brief duration. EKG is non-provocative. On exam patient is in no acute distress. Heart is regular without murmur. Lungs are clear bilaterally with good air movement. Abdomen is benign.Lower extremities symmetric as compared to each other. No calf tenderness. Negative Homan's. No palpable cords. Feel that this is low risk chest pain. Will place patient in the CDU under the chest pain protocol per ACT of her coronaries in the morning. Patient's BMI is just less than 35.  Raeford Razor, MD 06/01/12 [phone]

## 2012-10-14 ENCOUNTER — Encounter (HOSPITAL_COMMUNITY): Payer: Self-pay | Admitting: *Deleted

## 2012-10-14 ENCOUNTER — Emergency Department (HOSPITAL_COMMUNITY): Payer: Managed Care, Other (non HMO)

## 2012-10-14 ENCOUNTER — Emergency Department (HOSPITAL_COMMUNITY)
Admission: EM | Admit: 2012-10-14 | Discharge: 2012-10-14 | Disposition: A | Discharge status: Home / Self Care | Payer: Managed Care, Other (non HMO) | Attending: Emergency Medicine | Admitting: Emergency Medicine

## 2012-10-14 DIAGNOSIS — Z79899 Other long term (current) drug therapy: Secondary | ICD-10-CM | POA: Insufficient documentation

## 2012-10-14 DIAGNOSIS — I4891 Unspecified atrial fibrillation: Secondary | ICD-10-CM | POA: Insufficient documentation

## 2012-10-14 DIAGNOSIS — Z3201 Encounter for pregnancy test, result positive: Secondary | ICD-10-CM | POA: Insufficient documentation

## 2012-10-14 DIAGNOSIS — Y92410 Unspecified street and highway as the place of occurrence of the external cause: Secondary | ICD-10-CM | POA: Insufficient documentation

## 2012-10-14 DIAGNOSIS — Y9389 Activity, other specified: Secondary | ICD-10-CM | POA: Insufficient documentation

## 2012-10-14 DIAGNOSIS — Z7982 Long term (current) use of aspirin: Secondary | ICD-10-CM | POA: Insufficient documentation

## 2012-10-14 DIAGNOSIS — S0990XA Unspecified injury of head, initial encounter: Secondary | ICD-10-CM | POA: Insufficient documentation

## 2012-10-14 DIAGNOSIS — R112 Nausea with vomiting, unspecified: Secondary | ICD-10-CM | POA: Insufficient documentation

## 2012-10-14 DIAGNOSIS — S139XXA Sprain of joints and ligaments of unspecified parts of neck, initial encounter: Secondary | ICD-10-CM | POA: Insufficient documentation

## 2012-10-14 DIAGNOSIS — S161XXA Strain of muscle, fascia and tendon at neck level, initial encounter: Secondary | ICD-10-CM

## 2012-10-14 DIAGNOSIS — Z85828 Personal history of other malignant neoplasm of skin: Secondary | ICD-10-CM | POA: Insufficient documentation

## 2012-10-14 MED ORDER — ONDANSETRON HCL 4 MG/2ML IJ SOLN
4.0000 mg | Freq: Once | INTRAMUSCULAR | Status: AC
  Administered 2012-10-14: 4 mg via INTRAVENOUS

## 2012-10-14 MED ORDER — ONDANSETRON HCL 4 MG/2ML IJ SOLN
INTRAMUSCULAR | Status: AC
  Filled 2012-10-14: qty 2

## 2012-10-14 MED ORDER — METHOCARBAMOL 500 MG PO TABS: 500.0000 mg | ORAL_TABLET | Freq: Two times a day (BID) | ORAL | Status: DC

## 2012-10-14 MED ORDER — HYDROCODONE-ACETAMINOPHEN 5-325 MG PO TABS: 1.0000 | ORAL_TABLET | ORAL | Status: DC | PRN

## 2012-10-14 MED ORDER — SODIUM CHLORIDE 0.9 % IV BOLUS (SEPSIS): 1000.0000 mL | Freq: Once | INTRAVENOUS | Status: DC

## 2012-10-14 MED ORDER — ONDANSETRON HCL 4 MG/2ML IJ SOLN: 4.0000 mg | Freq: Once | INTRAMUSCULAR | Status: DC

## 2012-10-14 MED ORDER — MORPHINE SULFATE 2 MG/ML IJ SOLN
2.0000 mg | Freq: Once | INTRAMUSCULAR | Status: AC
  Administered 2012-10-14: 2 mg via INTRAVENOUS
  Filled 2012-10-14: qty 1

## 2012-10-14 MED ORDER — IBUPROFEN 600 MG PO TABS: 600.0000 mg | ORAL_TABLET | Freq: Four times a day (QID) | ORAL | Status: DC | PRN
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
## 2012-10-14 NOTE — ED Notes (Signed)
Pt became nauseous and vomited while in the room, will give pt zofran per protocol.  Pt st's she did not hit her head.

## 2012-10-14 NOTE — ED Notes (Signed)
Patient transported to CT 

## 2012-11-15 ENCOUNTER — Encounter: Payer: Self-pay | Admitting: Gynecology

## 2012-11-15 ENCOUNTER — Ambulatory Visit (INDEPENDENT_AMBULATORY_CARE_PROVIDER_SITE_OTHER): Payer: Managed Care, Other (non HMO) | Admitting: Gynecology

## 2012-11-15 VITALS — BP 140/84 | Ht 65.0 in | Wt 200.0 lb

## 2012-11-15 DIAGNOSIS — N926 Irregular menstruation, unspecified: Secondary | ICD-10-CM

## 2012-11-15 DIAGNOSIS — Z01419 Encounter for gynecological examination (general) (routine) without abnormal findings: Secondary | ICD-10-CM

## 2012-11-15 LAB — CBC WITH DIFFERENTIAL/PLATELET
Basophils Absolute: 0 10*3/uL (ref 0.0–0.1)
Basophils Relative: 0 % (ref 0–1)
Eosinophils Absolute: 0.1 10*3/uL (ref 0.0–0.7)
Eosinophils Relative: 1 % (ref 0–5)
HCT: 35.2 % — ABNORMAL LOW (ref 36.0–46.0)
MCH: 28.8 pg (ref 26.0–34.0)
MCHC: 33 g/dL (ref 30.0–36.0)
MCV: 87.3 fL (ref 78.0–100.0)
Monocytes Absolute: 0.4 10*3/uL (ref 0.1–1.0)
RDW: 14.2 % (ref 11.5–15.5)

## 2012-11-15 NOTE — Patient Instructions (Signed)
Recheck blood pressure at work. Current blood pressure 140/90. Keep menstrual calendar. As long as regular menses but spaced out then we will follow. If prolonged or atypical bleeding call me. Follow up in one year for annual exam.

## 2012-11-15 NOTE — Progress Notes (Signed)
Elizabeth [name] 03/12/1961 [id number]        52 y.o.  [id number] for annual exam.  Having more menstrual irregularity with skips. FSH last year was 52. Not having significant menopausal symptoms.  Past medical history,surgical history, medications, allergies, family history and social history were all reviewed and documented in the EPIC chart. ROS:  Was performed and pertinent positives and negatives are included in the history.  Exam: Kim assistant Filed Vitals:   11/15/12 1550  BP: 140/84  Height: 5\' 5"  (1.651 m)  Weight: 200 lb (90.719 kg)   General appearance  Normal Skin grossly normal Head/Neck normal with no cervical or supraclavicular adenopathy thyroid normal Lungs  clear Cardiac RR, without RMG Abdominal  soft, nontender, without masses, organomegaly or hernia Breasts  examined lying and sitting without masses, retractions, discharge or axillary adenopathy. Pelvic  Ext/BUS/vagina  normal   Cervix  normal   Uterus  anteverted, normal size, shape and contour, midline and mobile nontender   Adnexa  Without masses or tenderness    Anus and perineum  normal   Rectovaginal  normal sphincter tone without palpated masses or tenderness.    Assessment/Plan:  52 y.o. [id number] female for annual exam, status post tubal sterilization.   1. Perimenopausal.  Menses are spacing out, had menses in August and then LMP 09/2012. Options for management to include intermittent progesterone withdrawal every other month every third month to regulate discussed. Patient wants to monitor at present.  FSH is rising all consistent with a perimenopausal picture. The patient is prolonged or atypical bleeding she will call me otherwise follow up in one year. 2. Mammography due she will schedule. SBE monthly reviewed. 3. Pap smear 10/2011. No Pap smear done today.  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 We'll plan repeat at 3 year interval. 4. Health maintenance.  Blood  pressure mildly elevated at 140/84. She had not taken her medicines this morning. Recommend repeat at work to make sure that it normalizes. We'll check baseline CBC as she has been anemic in the past and urinalysis. Patient reports glucose and lipid profile done at other physician's office. Follow up one year, sooner as needed.    Dara Lords MD, 4:29 PM 11/15/2012

## 2012-11-16 LAB — URINALYSIS W MICROSCOPIC + REFLEX CULTURE
Bilirubin Urine: NEGATIVE
Crystals: NONE SEEN
Hgb urine dipstick: NEGATIVE
Ketones, ur: NEGATIVE mg/dL
Protein, ur: NEGATIVE mg/dL
Specific Gravity, Urine: 1.024 (ref 1.005–1.030)
Urobilinogen, UA: 0.2 mg/dL (ref 0.0–1.0)

## 2012-11-27 ENCOUNTER — Encounter: Payer: Self-pay | Admitting: Gynecology

## 2013-03-05 ENCOUNTER — Emergency Department (HOSPITAL_COMMUNITY): Payer: Managed Care, Other (non HMO)

## 2013-03-05 ENCOUNTER — Encounter (HOSPITAL_COMMUNITY): Payer: Self-pay | Admitting: *Deleted

## 2013-03-05 ENCOUNTER — Observation Stay (HOSPITAL_COMMUNITY)
Admission: EM | Admit: 2013-03-05 | Discharge: 2013-03-06 | Disposition: A | Discharge status: Home / Self Care | Payer: Managed Care, Other (non HMO) | Attending: Cardiology | Admitting: Cardiology

## 2013-03-05 DIAGNOSIS — I2510 Atherosclerotic heart disease of native coronary artery without angina pectoris: Secondary | ICD-10-CM | POA: Insufficient documentation

## 2013-03-05 DIAGNOSIS — R079 Chest pain, unspecified: Principal | ICD-10-CM

## 2013-03-05 DIAGNOSIS — Z8249 Family history of ischemic heart disease and other diseases of the circulatory system: Secondary | ICD-10-CM | POA: Insufficient documentation

## 2013-03-05 DIAGNOSIS — R0602 Shortness of breath: Secondary | ICD-10-CM | POA: Insufficient documentation

## 2013-03-05 DIAGNOSIS — R112 Nausea with vomiting, unspecified: Secondary | ICD-10-CM | POA: Insufficient documentation

## 2013-03-05 DIAGNOSIS — I10 Essential (primary) hypertension: Secondary | ICD-10-CM | POA: Insufficient documentation

## 2013-03-05 HISTORY — DX: Shortness of breath: R06.02

## 2013-03-05 HISTORY — DX: Essential (primary) hypertension: I10

## 2013-03-05 HISTORY — DX: Unspecified osteoarthritis, unspecified site: M19.90

## 2013-03-05 HISTORY — DX: Chest pain, unspecified: R07.9

## 2013-03-05 LAB — CBC WITH DIFFERENTIAL/PLATELET
Basophils Absolute: 0 10*3/uL (ref 0.0–0.1)
Basophils Relative: 0 % (ref 0–1)
Eosinophils Absolute: 0 10*3/uL (ref 0.0–0.7)
Eosinophils Relative: 0 % (ref 0–5)
HCT: 35.7 % — ABNORMAL LOW (ref 36.0–46.0)
Hemoglobin: 11.8 g/dL — ABNORMAL LOW (ref 12.0–15.0)
Lymphocytes Relative: 9 % — ABNORMAL LOW (ref 12–46)
Lymphs Abs: 0.9 10*3/uL (ref 0.7–4.0)
MCH: 29.9 pg (ref 26.0–34.0)
MCHC: 33.1 g/dL (ref 30.0–36.0)
MCV: 90.6 fL (ref 78.0–100.0)
Monocytes Absolute: 0.5 10*3/uL (ref 0.1–1.0)
Monocytes Relative: 4 % (ref 3–12)
Neutro Abs: 8.7 10*3/uL — ABNORMAL HIGH (ref 1.7–7.7)
Neutrophils Relative %: 86 % — ABNORMAL HIGH (ref 43–77)
Platelets: 278 10*3/uL (ref 150–400)
RBC: 3.94 MIL/uL (ref 3.87–5.11)
RDW: 15.1 % (ref 11.5–15.5)
WBC: 10.1 10*3/uL (ref 4.0–10.5)

## 2013-03-05 LAB — COMPREHENSIVE METABOLIC PANEL WITH GFR
ALT: 13 U/L (ref 0–35)
AST: 16 U/L (ref 0–37)
Albumin: 3.9 g/dL (ref 3.5–5.2)
Alkaline Phosphatase: 90 U/L (ref 39–117)
BUN: 20 mg/dL (ref 6–23)
CO2: 21 meq/L (ref 19–32)
Calcium: 8.9 mg/dL (ref 8.4–10.5)
Chloride: 101 meq/L (ref 96–112)
Creatinine, Ser: 1.15 mg/dL — ABNORMAL HIGH (ref 0.50–1.10)
GFR calc Af Amer: 63 mL/min — ABNORMAL LOW
GFR calc non Af Amer: 54 mL/min — ABNORMAL LOW
Glucose, Bld: 159 mg/dL — ABNORMAL HIGH (ref 70–99)
Potassium: 3.7 meq/L (ref 3.5–5.1)
Sodium: 137 meq/L (ref 135–145)
Total Bilirubin: 0.6 mg/dL (ref 0.3–1.2)
Total Protein: 7.1 g/dL (ref 6.0–8.3)

## 2013-03-05 LAB — ACETAMINOPHEN LEVEL: Acetaminophen (Tylenol), Serum: <15 ug/mL (ref 10–30)

## 2013-03-05 LAB — CG4 I-STAT (LACTIC ACID): Lactic Acid, Venous: 4.26 mmol/L — ABNORMAL HIGH (ref 0.5–2.2)

## 2013-03-05 LAB — POCT I-STAT TROPONIN I: Troponin i, poc: 0 ng/mL (ref 0.00–0.08)

## 2013-03-05 LAB — AMMONIA: Ammonia: 17 umol/L (ref 11–60)

## 2013-03-05 LAB — PROTIME-INR
INR: 0.99 (ref 0.00–1.49)
Prothrombin Time: 13 s (ref 11.6–15.2)

## 2013-03-05 LAB — POCT I-STAT, CHEM 8
HCT: 37 % (ref 36.0–46.0)
Hemoglobin: 12.6 g/dL (ref 12.0–15.0)
Potassium: 3.7 mEq/L (ref 3.5–5.1)
Sodium: 139 mEq/L (ref 135–145)

## 2013-03-05 LAB — ETHANOL: Alcohol, Ethyl (B): <11 mg/dL (ref 0–11)

## 2013-03-05 MED ORDER — ASPIRIN 81 MG PO CHEW
324.0000 mg | CHEWABLE_TABLET | Freq: Once | ORAL | Status: AC
  Administered 2013-03-05: 324 mg via ORAL
  Filled 2013-03-05: qty 1

## 2013-03-05 MED ORDER — SODIUM CHLORIDE 0.9 % IV BOLUS (SEPSIS)
1000.0000 mL | Freq: Once | INTRAVENOUS | Status: AC
  Administered 2013-03-05: 1000 mL via INTRAVENOUS

## 2013-03-05 MED ORDER — ONDANSETRON HCL 4 MG/2ML IJ SOLN
INTRAMUSCULAR | Status: AC
  Filled 2013-03-05: qty 2

## 2013-03-05 MED ORDER — ONDANSETRON HCL 4 MG/2ML IJ SOLN
4.0000 mg | Freq: Once | INTRAMUSCULAR | Status: AC
  Administered 2013-03-05: 4 mg via INTRAVENOUS

## 2013-03-05 NOTE — ED Notes (Signed)
Upon arrival to department pt noted to be diaphoretic, diaphoresis has since resolved however pt remains cool to the touch. Pt lies on stretcher w/ her eyes closed and after some coaching will open her eyes to verbal stimuli. Dr. Ranae Palms made aware and immediately at pt's bedside for assessment. Pt's radial pulse thready, LS clear and equal bilat. Pt follows some commands but does not move her toes when asked. EKG done on arrival to department.

## 2013-03-05 NOTE — ED Provider Notes (Signed)
History     CSN: [id number]  Arrival date & time 03/05/13  2204   First MD Initiated Contact with Patient 03/05/13 2220      Chief Complaint  Patient presents with  . Nausea  . Emesis    (Consider location/radiation/quality/duration/timing/severity/associated sxs/prior treatment) HPI Pt has not felt well since walking up hill Saturday afternoon. She has had fatigue, generalized maise and nausea. Family in room deny that the pt has c/o headache, CP or abdominal pain. Pt feeling increased weakness tonight. EMS called. Pt had brief syncopal episode while trying to get on stretched. +diaphoresis. Pt is not verbal at this time. Level 5 caveat.  Past Medical History  Diagnosis Date  . Atrial fibrillation   . ASCUS (atypical squamous cells of undetermined significance) on Pap smear 99,04, 4/10  . Colon polyps   . Chest pain 03/05/2013  . Hypertension   . Shortness of breath 03/06/2013    " i had shortness of breath with my chest pain "  . Arthritis     Past Surgical History  Procedure Laterality Date  . Tubal ligation    . Cardiac catheterization  3/09    Family History  Problem Relation Age of Onset  . Hypertension Mother   . Diabetes Mother   . Heart disease Mother   . Heart disease Father   . Cancer Father     stomach  . Heart disease Maternal Grandmother     History  Substance Use Topics  . Smoking status: Never Smoker   . Smokeless tobacco: Never Used  . Alcohol Use: Yes     Comment: rare    OB History   Grav Para Term Preterm Abortions TAB SAB Ect Mult Living   4 3 3  1  1   3       Review of Systems  Unable to perform ROS: Mental status change    Allergies  Review of patient's allergies indicates no known allergies.  Home Medications   Current Outpatient Rx  Name  Route  Sig  Dispense  Refill  . aspirin EC 81 MG tablet   Oral   Take 81 mg by mouth daily.         Marland Kitchen olmesartan-hydrochlorothiazide (BENICAR HCT) 20-12.5 MG per tablet   Oral    Take 1 tablet by mouth daily.         . metoprolol succinate (TOPROL XL) 50 MG 24 hr tablet   Oral   Take 1 tablet (50 mg total) by mouth daily. Take with or immediately following a meal.   30 tablet   3   . pantoprazole (PROTONIX) 40 MG tablet   Oral   Take 1 tablet (40 mg total) by mouth daily at 6 (six) AM.   30 tablet   3     BP 136/65  Pulse 63  Temp(Src) 98.1 F (36.7 C) (Oral)  Resp 18  Ht 5\' 5"  (1.651 m)  Wt 211 lb 4.8 oz (95.845 kg)  BMI 35.16 kg/m2  SpO2 100%  LMP 10/02/2012  Physical Exam  Nursing note and vitals reviewed. Constitutional: She appears well-developed and well-nourished.  Pale, drowsy  HENT:  Head: Normocephalic and atraumatic.  Mouth/Throat: Oropharynx is clear and moist.  Eyes: EOM are normal. Pupils are equal, round, and reactive to light.  Neck: Normal range of motion. Neck supple.  Cardiovascular: Normal rate and regular rhythm.   Pulmonary/Chest: Effort normal and breath sounds normal. No respiratory distress. She has no wheezes. She  has no rales. She exhibits no tenderness.  Abdominal: Soft. Bowel sounds are normal. She exhibits no distension and no mass. There is no tenderness. There is no rebound and no guarding.  Musculoskeletal: Normal range of motion. She exhibits no edema and no tenderness.  Neurological:  Drowsy and verbal. bl weak grip strength. Bl lower ext weakness.   Skin: Skin is dry. No rash noted. No erythema.  Cool ext    ED Course  Procedures (including critical care time)  Labs Reviewed  CBC WITH DIFFERENTIAL - Abnormal; Notable for the following:    Hemoglobin 11.8 (*)    HCT 35.7 (*)    Neutrophils Relative % 86 (*)    Neutro Abs 8.7 (*)    Lymphocytes Relative 9 (*)    All other components within normal limits  COMPREHENSIVE METABOLIC PANEL - Abnormal; Notable for the following:    Glucose, Bld 159 (*)    Creatinine, Ser 1.15 (*)    GFR calc non Af Amer 54 (*)    GFR calc Af Amer 63 (*)    All other  components within normal limits  URINALYSIS, ROUTINE W REFLEX MICROSCOPIC - Abnormal; Notable for the following:    APPearance CLOUDY (*)    Ketones, ur 15 (*)    All other components within normal limits  SEDIMENTATION RATE - Abnormal; Notable for the following:    Sed Rate 33 (*)    All other components within normal limits  HEPARIN LEVEL (UNFRACTIONATED) - Abnormal; Notable for the following:    Heparin Unfractionated 0.29 (*)    All other components within normal limits  APTT - Abnormal; Notable for the following:    aPTT 96 (*)    All other components within normal limits  CBC WITH DIFFERENTIAL - Abnormal; Notable for the following:    WBC 10.6 (*)    Neutrophils Relative % 87 (*)    Neutro Abs 9.2 (*)    Lymphocytes Relative 10 (*)    All other components within normal limits  COMPREHENSIVE METABOLIC PANEL - Abnormal; Notable for the following:    Glucose, Bld 103 (*)    GFR calc non Af Amer 63 (*)    GFR calc Af Amer 73 (*)    All other components within normal limits  POCT I-STAT, CHEM 8 - Abnormal; Notable for the following:    Creatinine, Ser 1.30 (*)    Glucose, Bld 155 (*)    Calcium, Ion 0.95 (*)    All other components within normal limits  CG4 I-STAT (LACTIC ACID) - Abnormal; Notable for the following:    Lactic Acid, Venous 4.26 (*)    All other components within normal limits  CULTURE, BLOOD (ROUTINE X 2)  CULTURE, BLOOD (ROUTINE X 2)  PROTIME-INR  ACETAMINOPHEN LEVEL  AMMONIA  URINE RAPID DRUG SCREEN (HOSP PERFORMED)  ETHANOL  TROPONIN I  TROPONIN I  TROPONIN I  PROTIME-INR  TSH  MAGNESIUM  HEMOGLOBIN A1C  LIPID PANEL  POCT I-STAT TROPONIN I   Nm Myocar Multi W/spect W/wall Motion / Ef  03/06/2013   *RADIOLOGY REPORT*  Clinical Data:  52 year old female with chest pain.  MYOCARDIAL IMAGING WITH SPECT (REST AND PHARMACOLOGIC-STRESS) GATED LEFT VENTRICULAR WALL MOTION STUDY LEFT VENTRICULAR EJECTION FRACTION  Technique:  Resting myocardial SPECT  imaging was initially performed after intravenous administration of radiopharmaceutical. Myocardial SPECT was subsequently performed after additional radiopharmaceutical injection during pharmacologic-stress supervised by the Cardiology staff.  Quantitative gated imaging was also performed to evaluate  left ventricular wall motion, and estimate left ventricular ejection fraction.  Radiopharmaceutical:  Tc-76m Cardiolite at rest and during stress.  Comparison: none  Findings:  Technique: Study is adequate.  Perfusion: There is a small to moderate focus of decreased counts within the mid and apical segments of the  anterior wall which is fixed on rest and stress.  No decreased counts on stress images to suggest reversible ischemia.  Wall motion:  No focal wall motion abnormality.  Normal contractility.  Left ventricular ejection fraction:  Calculated left ventricular ejection fraction = 70%.  End-diastolic volume equals 71 ml  End-systolic volume equals 21 ml  IMPRESSION:  1. Fixed defect within the anterior wall without associated wall motion abnormality favors breast attenuation over  scar. 2.  No evidence reversible ischemia  3.  Left ventricular ejection fraction equal71%   Original Report Authenticated By: Genevive Bi, M.D.     1. Chest pain       Date: 03/05/2013  Rate: 69  Rhythm: normal sinus rhythm  QRS Axis: normal  Intervals: normal  ST/T Wave abnormalities: ST depressions inferiorly and ST depressions laterally  Conduction Disutrbances:none  Narrative Interpretation:   Old EKG Reviewed: changes noted   MDM   Pt is now more alert. Moving all ext without deficit. She states she has had episodic CP since Saturday but does not currently.   Discussed with Dr Sharyn Lull. Adivsed to place nitro paste and start heparin. Will see and admit.        Loren Racer, MD 03/08/13 [phone]

## 2013-03-05 NOTE — ED Notes (Signed)
Per EMS - pt w/ gradual onset of weakness that began yesterday, worse tonight - pt barely unable to stand/eat this evening - pt w/ n/v en route - pt also noted to be diaphoretic on arrival. EKG NSR at a rate of 70. Pt experienced a syncopal episode when she stood up to get onto EMS stretcher, LOC was very brief, a few seconds.

## 2013-03-05 NOTE — ED Notes (Signed)
I stat Lactic acid results given to Dr. Ranae Palms by B. Bing Plume, EMT

## 2013-03-06 ENCOUNTER — Observation Stay (HOSPITAL_COMMUNITY): Payer: Managed Care, Other (non HMO)

## 2013-03-06 ENCOUNTER — Encounter (HOSPITAL_COMMUNITY): Payer: Self-pay | Admitting: General Practice

## 2013-03-06 DIAGNOSIS — R0602 Shortness of breath: Secondary | ICD-10-CM

## 2013-03-06 HISTORY — DX: Shortness of breath: R06.02

## 2013-03-06 LAB — TROPONIN I
Troponin I: <0.3 ng/mL (ref <0.30)
Troponin I: <0.3 ng/mL (ref <0.30)
Troponin I: <0.3 ng/mL (ref <0.30)

## 2013-03-06 LAB — URINALYSIS, ROUTINE W REFLEX MICROSCOPIC
Hgb urine dipstick: NEGATIVE
Nitrite: NEGATIVE
Specific Gravity, Urine: 1.013 (ref 1.005–1.030)
Urobilinogen, UA: 0.2 mg/dL (ref 0.0–1.0)
pH: 6.5 (ref 5.0–8.0)

## 2013-03-06 LAB — HEMOGLOBIN A1C: Hgb A1c MFr Bld: 5.3 % (ref <5.7)

## 2013-03-06 LAB — COMPREHENSIVE METABOLIC PANEL
AST: 16 U/L (ref 0–37)
Albumin: 3.9 g/dL (ref 3.5–5.2)
Alkaline Phosphatase: 97 U/L (ref 39–117)
BUN: 19 mg/dL (ref 6–23)
CO2: 24 mEq/L (ref 19–32)
Chloride: 103 mEq/L (ref 96–112)
Creatinine, Ser: 1.02 mg/dL (ref 0.50–1.10)
GFR calc non Af Amer: 63 mL/min — ABNORMAL LOW (ref >90)
Potassium: 3.6 mEq/L (ref 3.5–5.1)
Total Bilirubin: 0.5 mg/dL (ref 0.3–1.2)

## 2013-03-06 LAB — APTT: aPTT: 96 seconds — ABNORMAL HIGH (ref 24–37)

## 2013-03-06 LAB — RAPID URINE DRUG SCREEN, HOSP PERFORMED: Barbiturates: NOT DETECTED

## 2013-03-06 LAB — PROTIME-INR: Prothrombin Time: 14.1 seconds (ref 11.6–15.2)

## 2013-03-06 LAB — CBC WITH DIFFERENTIAL/PLATELET
Basophils Absolute: 0 10*3/uL (ref 0.0–0.1)
Eosinophils Relative: 0 % (ref 0–5)
HCT: 38.9 % (ref 36.0–46.0)
Hemoglobin: 12.6 g/dL (ref 12.0–15.0)
Lymphocytes Relative: 10 % — ABNORMAL LOW (ref 12–46)
Lymphs Abs: 1.1 10*3/uL (ref 0.7–4.0)
MCV: 91.1 fL (ref 78.0–100.0)
Monocytes Absolute: 0.3 10*3/uL (ref 0.1–1.0)
Neutro Abs: 9.2 10*3/uL — ABNORMAL HIGH (ref 1.7–7.7)
RBC: 4.27 MIL/uL (ref 3.87–5.11)
RDW: 15.3 % (ref 11.5–15.5)
WBC: 10.6 10*3/uL — ABNORMAL HIGH (ref 4.0–10.5)

## 2013-03-06 LAB — MAGNESIUM: Magnesium: 2 mg/dL (ref 1.5–2.5)

## 2013-03-06 LAB — LIPID PANEL
HDL: 42 mg/dL (ref >39)
LDL Cholesterol: 86 mg/dL (ref 0–99)
Triglycerides: 88 mg/dL (ref <150)
VLDL: 18 mg/dL (ref 0–40)

## 2013-03-06 LAB — SEDIMENTATION RATE: Sed Rate: 33 mm/hr — ABNORMAL HIGH (ref 0–22)

## 2013-03-06 MED ORDER — PANTOPRAZOLE SODIUM 40 MG PO TBEC: 40.0000 mg | DELAYED_RELEASE_TABLET | Freq: Every day | ORAL | Status: DC

## 2013-03-06 MED ORDER — ASPIRIN EC 81 MG PO TBEC: 81.0000 mg | DELAYED_RELEASE_TABLET | Freq: Every day | ORAL | Status: DC

## 2013-03-06 MED ORDER — NITROGLYCERIN 2 % TD OINT
0.5000 [in_us] | TOPICAL_OINTMENT | Freq: Four times a day (QID) | TRANSDERMAL | Status: DC
  Administered 2013-03-06 (×2): 0.5 [in_us] via TOPICAL
  Filled 2013-03-06: qty 30

## 2013-03-06 MED ORDER — ASPIRIN 81 MG PO CHEW: 324.0000 mg | CHEWABLE_TABLET | ORAL | Status: DC

## 2013-03-06 MED ORDER — METOPROLOL SUCCINATE ER 50 MG PO TB24: 50.0000 mg | ORAL_TABLET | Freq: Every day | ORAL | Status: DC

## 2013-03-06 MED ORDER — NITROGLYCERIN 0.4 MG SL SUBL: 0.4000 mg | SUBLINGUAL_TABLET | SUBLINGUAL | Status: DC | PRN

## 2013-03-06 MED ORDER — METOPROLOL TARTRATE 12.5 MG HALF TABLET
12.5000 mg | ORAL_TABLET | Freq: Two times a day (BID) | ORAL | Status: DC
  Administered 2013-03-06: 12.5 mg via ORAL
  Filled 2013-03-06 (×3): qty 1

## 2013-03-06 MED ORDER — HEPARIN (PORCINE) IN NACL 100-0.45 UNIT/ML-% IJ SOLN
1200.0000 [IU]/h | INTRAMUSCULAR | Status: DC
  Administered 2013-03-06: 1100 [IU]/h via INTRAVENOUS
  Administered 2013-03-06: 1200 [IU]/h via INTRAVENOUS
  Filled 2013-03-06 (×2): qty 250

## 2013-03-06 MED ORDER — REGADENOSON 0.4 MG/5ML IV SOLN: 0.4000 mg | Freq: Once | INTRAVENOUS | Status: AC

## 2013-03-06 MED ORDER — ONDANSETRON HCL 4 MG/2ML IJ SOLN
4.0000 mg | Freq: Four times a day (QID) | INTRAMUSCULAR | Status: DC | PRN
  Administered 2013-03-06 (×2): 4 mg via INTRAVENOUS
  Filled 2013-03-06 (×2): qty 2

## 2013-03-06 MED ORDER — TECHNETIUM TC 99M SESTAMIBI GENERIC - CARDIOLITE
30.0000 | Freq: Once | INTRAVENOUS | Status: AC | PRN
  Administered 2013-03-06: 30 via INTRAVENOUS

## 2013-03-06 MED ORDER — HYDROCHLOROTHIAZIDE 12.5 MG PO CAPS
12.5000 mg | ORAL_CAPSULE | Freq: Every day | ORAL | Status: DC
  Administered 2013-03-06: 12.5 mg via ORAL
  Filled 2013-03-06: qty 1

## 2013-03-06 MED ORDER — TECHNETIUM TC 99M SESTAMIBI GENERIC - CARDIOLITE
10.0000 | Freq: Once | INTRAVENOUS | Status: AC | PRN
  Administered 2013-03-06: 10 via INTRAVENOUS

## 2013-03-06 MED ORDER — IRBESARTAN 150 MG PO TABS: 150.0000 mg | ORAL_TABLET | Freq: Every day | ORAL | Status: DC

## 2013-03-06 MED ORDER — ACETAMINOPHEN 325 MG PO TABS
650.0000 mg | ORAL_TABLET | ORAL | Status: DC | PRN
  Administered 2013-03-06: 650 mg via ORAL
  Filled 2013-03-06: qty 2

## 2013-03-06 MED ORDER — NITROGLYCERIN 2 % TD OINT
0.5000 [in_us] | TOPICAL_OINTMENT | Freq: Once | TRANSDERMAL | Status: AC
  Administered 2013-03-06: 0.5 [in_us] via TOPICAL
  Filled 2013-03-06: qty 1

## 2013-03-06 MED ORDER — FAMOTIDINE 20 MG PO TABS
20.0000 mg | ORAL_TABLET | Freq: Two times a day (BID) | ORAL | Status: DC
  Administered 2013-03-06: 20 mg via ORAL
  Filled 2013-03-06 (×2): qty 1

## 2013-03-06 MED ORDER — ASPIRIN EC 81 MG PO TBEC
81.0000 mg | DELAYED_RELEASE_TABLET | Freq: Every day | ORAL | Status: DC
  Administered 2013-03-06: 81 mg via ORAL
  Filled 2013-03-06: qty 1

## 2013-03-06 MED ORDER — REGADENOSON 0.4 MG/5ML IV SOLN
INTRAVENOUS | Status: AC
  Administered 2013-03-06: 0.4 mg via INTRAVENOUS
  Filled 2013-03-06: qty 5

## 2013-03-06 MED ORDER — OLMESARTAN MEDOXOMIL-HCTZ 20-12.5 MG PO TABS: 1.0000 | ORAL_TABLET | Freq: Every day | ORAL | Status: DC

## 2013-03-06 MED ORDER — IRBESARTAN 150 MG PO TABS
150.0000 mg | ORAL_TABLET | Freq: Every day | ORAL | Status: DC
  Administered 2013-03-06: 150 mg via ORAL
  Filled 2013-03-06: qty 1

## 2013-03-06 MED ORDER — HEPARIN BOLUS VIA INFUSION
4000.0000 [IU] | Freq: Once | INTRAVENOUS | Status: AC
  Administered 2013-03-06: 4000 [IU] via INTRAVENOUS

## 2013-03-06 MED ORDER — ASPIRIN 300 MG RE SUPP
300.0000 mg | RECTAL | Status: DC
  Filled 2013-03-06: qty 1

## 2013-03-06 NOTE — Progress Notes (Signed)
ANTICOAGULATION CONSULT NOTE - Initial Consult  Pharmacy Consult for Heparin Indication: chest pain/ACS  No Known Allergies  Patient Measurements: Height: 5' 4.96" (165 cm) Weight: 200 lb 9.9 oz (91 kg) (10/2012) IBW/kg (Calculated) : 56.91 Heparin Dosing Weight: 80 kg   Vital Signs: Temp: 97.6 F (36.4 C) (06/16 2349) Temp src: Oral (06/16 2349) BP: 159/65 mmHg (06/16 2349) Pulse Rate: 50 (06/16 2349)  Labs:  Recent Labs  03/05/13 2313 03/05/13 2320  HGB 11.8* 12.6  HCT 35.7* 37.0  PLT 278  --   LABPROT 13.0  --   INR 0.99  --   CREATININE 1.15* 1.30*    Estimated Creatinine Clearance: 57 ml/min (by C-G formula based on Cr of 1.3).   Medical History: Past Medical History  Diagnosis Date  . Atrial fibrillation   . ASCUS (atypical squamous cells of undetermined significance) on Pap smear 99,04, 4/10  . Colon polyps     Medications:  ASA  Bystolic  Benicar HCT  Assessment: 52 yo female with chest pain for heparin  Goal of Therapy:  Heparin level 0.3-0.7 units/ml Monitor platelets by anticoagulation protocol: Yes   Plan:  Heparin 4000 units IV bolus, then 1100 units/hr Check heparin level in 6 hours.  Eddie Candle 03/06/2013,12:22 AM

## 2013-03-06 NOTE — H&P (Signed)
Elizabeth [name] is an 52 y.o. female.   Chief Complaint: Chest pain HPI: Patient is 52 year old female with past medical history significant for mild coronary artery disease, hypertension, history of atrial fibrillation, history of colonic polyp, came to the ER complaining of recurrent retrosternal chest pain described as heaviness grade 3/10 associated with dizziness mild shortness of breath and lethargy. Patient states since her son's graduation on Saturday evening he has been feeling tired lethargic associated with chest pain off and on and poor appetite. Denies history of PND orthopnea leg swelling denies palpitation lightheadedness or syncope. EKG done in the ER showed normal sinus rhythm with minor ST-T wave changes in inferolateral leads the and poor R-wave progression. Patient presently denies any chest pain. Denies any recent cardiac workup. Patient had cardiac cath in 2009 which showed mild coronary artery disease.  Past Medical History  Diagnosis Date  . Atrial fibrillation   . ASCUS (atypical squamous cells of undetermined significance) on Pap smear 99,04, 4/10  . Colon polyps     Past Surgical History  Procedure Laterality Date  . Tubal ligation    . Cardiac catheterization  3/09    Family History  Problem Relation Age of Onset  . Hypertension Mother   . Diabetes Mother   . Heart disease Mother   . Heart disease Father   . Cancer Father     stomach  . Heart disease Maternal Grandmother    Social History:  reports that she has never smoked. She has never used smokeless tobacco. She reports that  drinks alcohol. She reports that she does not use illicit drugs.  Allergies: No Known Allergies   (Not in a hospital admission)  Results for orders placed during the hospital encounter of 03/05/13 (from the past 48 hour(s))  CBC WITH DIFFERENTIAL     Status: Abnormal   Collection Time    03/05/13 11:13 PM      Result Value Range   WBC 10.1  4.0 - 10.5 K/uL   RBC 3.94  3.87 -  5.11 MIL/uL   Hemoglobin 11.8 (*) 12.0 - 15.0 g/dL   HCT 16.1 (*) 09.6 - 04.5 %   MCV 90.6  78.0 - 100.0 fL   MCH 29.9  26.0 - 34.0 pg   MCHC 33.1  30.0 - 36.0 g/dL   RDW 40.9  81.1 - 91.4 %   Platelets 278  150 - 400 K/uL   Neutrophils Relative % 86 (*) 43 - 77 %   Neutro Abs 8.7 (*) 1.7 - 7.7 K/uL   Lymphocytes Relative 9 (*) 12 - 46 %   Lymphs Abs 0.9  0.7 - 4.0 K/uL   Monocytes Relative 4  3 - 12 %   Monocytes Absolute 0.5  0.1 - 1.0 K/uL   Eosinophils Relative 0  0 - 5 %   Eosinophils Absolute 0.0  0.0 - 0.7 K/uL   Basophils Relative 0  0 - 1 %   Basophils Absolute 0.0  0.0 - 0.1 K/uL  COMPREHENSIVE METABOLIC PANEL     Status: Abnormal   Collection Time    03/05/13 11:13 PM      Result Value Range   Sodium 137  135 - 145 mEq/L   Potassium 3.7  3.5 - 5.1 mEq/L   Chloride 101  96 - 112 mEq/L   CO2 21  19 - 32 mEq/L   Glucose, Bld 159 (*) 70 - 99 mg/dL   BUN 20  6 - 23 mg/dL  Creatinine, Ser 1.15 (*) 0.50 - 1.10 mg/dL   Calcium 8.9  8.4 - 16.1 mg/dL   Total Protein 7.1  6.0 - 8.3 g/dL   Albumin 3.9  3.5 - 5.2 g/dL   AST 16  0 - 37 U/L   ALT 13  0 - 35 U/L   Alkaline Phosphatase 90  39 - 117 U/L   Total Bilirubin 0.6  0.3 - 1.2 mg/dL   GFR calc non Af Amer 54 (*) >90 mL/min   GFR calc Af Amer 63 (*) >90 mL/min   Comment:            The eGFR has been calculated     using the CKD EPI equation.     This calculation has not been     validated in all clinical     situations.     eGFR's persistently     <90 mL/min signify     possible Chronic Kidney Disease.  PROTIME-INR     Status: None   Collection Time    03/05/13 11:13 PM      Result Value Range   Prothrombin Time 13.0  11.6 - 15.2 seconds   INR 0.99  0.00 - 1.49  ACETAMINOPHEN LEVEL     Status: None   Collection Time    03/05/13 11:13 PM      Result Value Range   Acetaminophen (Tylenol), Serum <15.0  10 - 30 ug/mL   Comment:            THERAPEUTIC CONCENTRATIONS VARY     SIGNIFICANTLY. A RANGE OF 10-30      ug/mL MAY BE AN EFFECTIVE     CONCENTRATION FOR MANY PATIENTS.     HOWEVER, SOME ARE BEST TREATED     AT CONCENTRATIONS OUTSIDE THIS     RANGE.     ACETAMINOPHEN CONCENTRATIONS     >150 ug/mL AT 4 HOURS AFTER     INGESTION AND >50 ug/mL AT 12     HOURS AFTER INGESTION ARE     OFTEN ASSOCIATED WITH TOXIC     REACTIONS.  AMMONIA     Status: None   Collection Time    03/05/13 11:13 PM      Result Value Range   Ammonia 17  11 - 60 umol/L  ETHANOL     Status: None   Collection Time    03/05/13 11:13 PM      Result Value Range   Alcohol, Ethyl (B) <11  0 - 11 mg/dL   Comment:            LOWEST DETECTABLE LIMIT FOR     SERUM ALCOHOL IS 11 mg/dL     FOR MEDICAL PURPOSES ONLY  SEDIMENTATION RATE     Status: Abnormal   Collection Time    03/05/13 11:13 PM      Result Value Range   Sed Rate 33 (*) 0 - 22 mm/hr  POCT I-STAT TROPONIN I     Status: None   Collection Time    03/05/13 11:18 PM      Result Value Range   Troponin i, poc 0.00  0.00 - 0.08 ng/mL   Comment 3            Comment: Due to the release kinetics of cTnI,     a negative result within the first hours     of the onset of symptoms does not rule out     myocardial infarction with certainty.  If myocardial infarction is still suspected,     repeat the test at appropriate intervals.  POCT I-STAT, CHEM 8     Status: Abnormal   Collection Time    03/05/13 11:20 PM      Result Value Range   Sodium 139  135 - 145 mEq/L   Potassium 3.7  3.5 - 5.1 mEq/L   Chloride 106  96 - 112 mEq/L   BUN 20  6 - 23 mg/dL   Creatinine, Ser 1.61 (*) 0.50 - 1.10 mg/dL   Glucose, Bld 096 (*) 70 - 99 mg/dL   Calcium, Ion 0.45 (*) 1.12 - 1.23 mmol/L   TCO2 20  0 - 100 mmol/L   Hemoglobin 12.6  12.0 - 15.0 g/dL   HCT 40.9  81.1 - 91.4 %  CG4 I-STAT (LACTIC ACID)     Status: Abnormal   Collection Time    03/05/13 11:20 PM      Result Value Range   Lactic Acid, Venous 4.26 (*) 0.5 - 2.2 mmol/L  URINALYSIS, ROUTINE W REFLEX  MICROSCOPIC     Status: Abnormal   Collection Time    03/06/13 12:54 AM      Result Value Range   Color, Urine YELLOW  YELLOW   APPearance CLOUDY (*) CLEAR   Specific Gravity, Urine 1.013  1.005 - 1.030   pH 6.5  5.0 - 8.0   Glucose, UA NEGATIVE  NEGATIVE mg/dL   Hgb urine dipstick NEGATIVE  NEGATIVE   Bilirubin Urine NEGATIVE  NEGATIVE   Ketones, ur 15 (*) NEGATIVE mg/dL   Protein, ur NEGATIVE  NEGATIVE mg/dL   Urobilinogen, UA 0.2  0.0 - 1.0 mg/dL   Nitrite NEGATIVE  NEGATIVE   Leukocytes, UA NEGATIVE  NEGATIVE   Comment: MICROSCOPIC NOT DONE ON URINES WITH NEGATIVE PROTEIN, BLOOD, LEUKOCYTES, NITRITE, OR GLUCOSE <1000 mg/dL.   Ct Head Wo Contrast  03/05/2013   *RADIOLOGY REPORT*  Clinical Data: Nausea and emesis  CT HEAD WITHOUT CONTRAST  Technique:  Contiguous axial images were obtained from the base of the skull through the vertex without contrast.  Comparison: Head CT 10/14/2012  Findings: No acute intracranial abnormality is identified. Specifically, there is no hemorrhage, hydrocephalus, mass effect, midline shift, or evidence of acute cortically based infarction.  The skull is intact.  Visualized paranasal sinuses and mastoid air cells are clear.  IMPRESSION: No acute intracranial abnormality.   Original Report Authenticated By: Britta Mccreedy, M.D.   Dg Chest Port 1 View  03/05/2013   *RADIOLOGY REPORT*  Clinical Data: Altered mental status.  Shortness of breath. Weakness.  PORTABLE CHEST - 1 VIEW  Comparison: Chest x-ray 05/24/2012.  Findings: Lung volumes are slightly low.  Mild crowding of the pulmonary vasculature, without frank pulmonary edema.  No acute consolidative airspace disease.  No pleural effusions.  Borderline cardiomegaly. The patient is rotated to the right on today's exam, resulting in distortion of the mediastinal contours and reduced diagnostic sensitivity and specificity for mediastinal pathology. Transcutaneous defibrillator pad projecting over the lower left  hemithorax.  IMPRESSION: 1.  Low lung volumes without radiographic evidence of acute cardiopulmonary disease.   Original Report Authenticated By: Trudie Reed, M.D.    Review of Systems  Constitutional: Negative for fever and chills.  HENT: Negative for hearing loss.   Eyes: Negative for blurred vision.  Respiratory: Negative for cough, hemoptysis and sputum production.   Cardiovascular: Positive for chest pain. Negative for palpitations, orthopnea, claudication and leg swelling.  Genitourinary: Negative  for dysuria and urgency.  Neurological: Negative for dizziness and headaches.    Blood pressure 162/71, pulse 60, temperature 97.6 F (36.4 C), temperature source Oral, resp. rate 13, height 5' 4.96" (1.65 m), weight 91 kg (200 lb 9.9 oz), last menstrual period 10/02/2012, SpO2 100.00%. Physical Exam  Constitutional: She is oriented to person, place, and time. She appears well-developed and well-nourished.  HENT:  Head: Normocephalic and atraumatic.  Eyes: Conjunctivae and EOM are normal. Pupils are equal, round, and reactive to light.  Neck: Neck supple. No JVD present. No tracheal deviation present. No thyromegaly present.  Cardiovascular:  Regular rate and rhythm S1-S2 normal there is soft systolic murmur no S3 or S4 gallop  Respiratory: Effort normal and breath sounds normal. No respiratory distress. She has no wheezes. She has no rales.  GI: Bowel sounds are normal. She exhibits no distension. There is no tenderness. There is no rebound and no guarding.  Musculoskeletal: She exhibits no edema.  Neurological: She is alert and oriented to person, place, and time.     Assessment/Plan Chest pain rule out MI Mild coronary artery disease Hypertension History of paroxysmal atrial fibrillation in the past History of colonic polyp Positive family history of coronary artery disease Plan As per orders  Jaculin Rasmus N 03/06/2013, 1:27 AM

## 2013-03-06 NOTE — Progress Notes (Signed)
Subjective:  She denies any chest pain or shortness of breath Objective:  Vital Signs in the last 24 hours: Temp:  [97.6 F (36.4 C)-98.3 F (36.8 C)] 98 F (36.7 C) (06/17 0630) Pulse Rate:  [50-80] 80 (06/17 0630) Resp:  [13-26] 18 (06/17 0630) BP: (104-163)/(42-82) 144/82 mmHg (06/17 1012) SpO2:  [92 %-100 %] 100 % (06/17 0630) Weight:  [91 kg (200 lb 9.9 oz)-95.845 kg (211 lb 4.8 oz)] 95.845 kg (211 lb 4.8 oz) (06/17 0244)  Intake/Output from previous day:   Intake/Output from this shift:    Physical Exam: Neck: no adenopathy, no carotid bruit, no JVD and supple, symmetrical, trachea midline Lungs: clear to auscultation bilaterally Heart: regular rate and rhythm, S1, S2 normal and Soft systolic murmur noted no S3 gallop Abdomen: soft, non-tender; bowel sounds normal; no masses,  no organomegaly Extremities: extremities normal, atraumatic, no cyanosis or edema  Lab Results:  Recent Labs  03/05/13 2313 03/05/13 2320 03/06/13 0310  WBC 10.1  --  10.6*  HGB 11.8* 12.6 12.6  PLT 278  --  306    Recent Labs  03/05/13 2313 03/05/13 2320 03/06/13 0310  NA 137 139 140  K 3.7 3.7 3.6  CL 101 106 103  CO2 21  --  24  GLUCOSE 159* 155* 103*  BUN 20 20 19   CREATININE 1.15* 1.30* 1.02    Recent Labs  03/06/13 0310  TROPONINI <0.30   Hepatic Function Panel  Recent Labs  03/06/13 0310  PROT 7.5  ALBUMIN 3.9  AST 16  ALT 12  ALKPHOS 97  BILITOT 0.5    Recent Labs  03/06/13 0310  CHOL 146   No results found for this basename: PROTIME,  in the last 72 hours  Imaging: Imaging results have been reviewed and Ct Head Wo Contrast  03/05/2013   *RADIOLOGY REPORT*  Clinical Data: Nausea and emesis  CT HEAD WITHOUT CONTRAST  Technique:  Contiguous axial images were obtained from the base of the skull through the vertex without contrast.  Comparison: Head CT 10/14/2012  Findings: No acute intracranial abnormality is identified. Specifically, there is no  hemorrhage, hydrocephalus, mass effect, midline shift, or evidence of acute cortically based infarction.  The skull is intact.  Visualized paranasal sinuses and mastoid air cells are clear.  IMPRESSION: No acute intracranial abnormality.   Original Report Authenticated By: Britta Mccreedy, M.D.   Dg Chest Port 1 View  03/05/2013   *RADIOLOGY REPORT*  Clinical Data: Altered mental status.  Shortness of breath. Weakness.  PORTABLE CHEST - 1 VIEW  Comparison: Chest x-ray 05/24/2012.  Findings: Lung volumes are slightly low.  Mild crowding of the pulmonary vasculature, without frank pulmonary edema.  No acute consolidative airspace disease.  No pleural effusions.  Borderline cardiomegaly. The patient is rotated to the right on today's exam, resulting in distortion of the mediastinal contours and reduced diagnostic sensitivity and specificity for mediastinal pathology. Transcutaneous defibrillator pad projecting over the lower left hemithorax.  IMPRESSION: 1.  Low lung volumes without radiographic evidence of acute cardiopulmonary disease.   Original Report Authenticated By: Trudie Reed, M.D.    Cardiac Studies:  Assessment/Plan:  Status post Chest pain MI ruled out Mild coronary artery disease  Hypertension  History of paroxysmal atrial fibrillation in the past  History of colonic polyp  Positive family history of coronary artery disease  Plan Continue present management Schedule for nuclear stress test today  LOS: 1 day    Julies Carmickle N 03/06/2013, 10:16 AM

## 2013-03-06 NOTE — Discharge Summary (Signed)
  Discharge summary dictated on 03/06/2013 dictation number is [phone]

## 2013-03-06 NOTE — Progress Notes (Signed)
UR Completed Kirstin Kugler Graves-Bigelow, RN,BSN [phone]  

## 2013-03-06 NOTE — Progress Notes (Signed)
ANTICOAGULATION CONSULT NOTE - Initial Consult  Pharmacy Consult for Heparin Indication: chest pain/ACS  No Known Allergies  Patient Measurements: Height: 5\' 5"  (165.1 cm) Weight: 211 lb 4.8 oz (95.845 kg) IBW/kg (Calculated) : 57 Heparin Dosing Weight: 80 kg   Vital Signs: Temp: 98 F (36.7 C) (06/17 0630) Temp src: Oral (06/17 0630) BP: 144/82 mmHg (06/17 1012) Pulse Rate: 80 (06/17 0630)  Labs:  Recent Labs  03/05/13 2313 03/05/13 2320 03/06/13 0310 03/06/13 1130  HGB 11.8* 12.6 12.6  --   HCT 35.7* 37.0 38.9  --   PLT 278  --  306  --   APTT  --   --  96*  --   LABPROT 13.0  --  14.1  --   INR 0.99  --  1.10  --   HEPARINUNFRC  --   --   --  0.29*  CREATININE 1.15* 1.30* 1.02  --   TROPONINI  --   --  <0.30 <0.30    Estimated Creatinine Clearance: 74.7 ml/min (by C-G formula based on Cr of 1.02).   Medical History: Past Medical History  Diagnosis Date  . Atrial fibrillation   . ASCUS (atypical squamous cells of undetermined significance) on Pap smear 99,04, 4/10  . Colon polyps   . Chest pain 03/05/2013  . Hypertension   . Shortness of breath 03/06/2013    " i had shortness of breath with my chest pain "  . Arthritis     Medications:  ASA  Bystolic  Benicar HCT  Assessment: 52 yo female with chest pain for heparin.  Heparin level is just slightly below goal at 0.29.    Goal of Therapy:  Heparin level 0.3-0.7 units/ml Monitor platelets by anticoagulation protocol: Yes   Plan:  Increase Heparin to 1200 units/hr. Check heparin level in 6 hours.  Daily heparin level/cbc.   Wendie Simmer, PharmD, BCPS Clinical Pharmacist  Pager: [phone]

## 2013-03-06 NOTE — Progress Notes (Signed)
Pt discharged to home per MD order. Pt received and reviewed all discharge instructions and medication information including follow-up appointments and prescriptions. Pt verbalized understanding. Pt alert and oriented at discharge with no complaints of pain.  Pt escorted to private vehicle via wheelchair by nurse tech. Coates, Haidar Muse Elizabeth  

## 2013-03-06 NOTE — ED Notes (Signed)
Pt much more A&O at this time, able to converse appropriately with this writer and follow commands. Pt c/o nausea at present.

## 2013-03-06 NOTE — Progress Notes (Signed)
Pt c/o of facial numbness/tingling bilaterally and around forehead and mouth for first time to RN today. Pt states this has been "ongoing since Saturday." Pt states that numbness/tingling "is not better or worse and is constant."   Pt states she does not remember if she notified MD but states that she "did notify EMS". MD notified. No new orders received. Will continue to monitor. Efraim Kaufmann

## 2013-03-07 NOTE — Discharge Summary (Signed)
NAMEJANEESE, MCGLOIN                ACCOUNT NO.:  [id number]  MEDICAL RECORD NO.:  [id number]  LOCATION:  [id number]                        FACILITY:  [facility]  PHYSICIAN:  Eduardo Osier. Sharyn Lull, M.D. DATE OF BIRTH:  Jul 23, 1961  DATE OF ADMISSION:  03/05/2013 DATE OF DISCHARGE:  03/06/2013                              DISCHARGE SUMMARY   ADMITTING DIAGNOSES: 1. Chest pain, rule out myocardial infarction. 2. Mild coronary artery disease in the past. 3. Hypertension. 4. History of paroxysmal atrial fibrillation in the past. 5. History of colonic polyp in the past. 6. Positive family history of coronary artery disease.  DISCHARGE DIAGNOSES: 1. Status post chest pain, myocardial infarction ruled out.  Negative     nuclear stress test. 2. Mild coronary artery disease in the past. 3. Hypertension. 4. History of paroxysmal atrial fibrillation in the past. 5. History of colonic polyp. 6. Positive family history of coronary artery disease.  DISCHARGE HOME MEDICATIONS: 1. Toprol-XL 50 mg 1 tablet daily. 2. Protonix 40 mg 1 tablet daily. 3. Enteric-coated aspirin 81 mg 1 tablet daily. 4. Benicar/HCT 20/12.5 mg 1 tablet daily.  DIET:  Low salt, low cholesterol.  ACTIVITY:  Increase activity as tolerated.  FOLLOWUP:  With me in 1 week.  CONDITION AT DISCHARGE:  Stable.  BRIEF HISTORY AND HOSPITAL COURSE:  Elizabeth [name] is a 52 year old female with past medical history significant for mild coronary artery disease in the past, hypertension, history of atrial fibrillation in the past, history of colonic polyps, came to the ER complaining of recurrent retrosternal chest pain described as heaviness, grade 3/10 associated with dizziness and mild shortness of breath and diaphoresis.  The patient states since her son's graduation on Saturday evening, she has been feeling tired, lethargic associated with chest pain off and on, and poor appetite.  The patient denies history of PND, orthopnea, or  leg swelling.  Denies palpitation, lightheadedness, or syncope.  EKG done in the ER showed normal sinus rhythm with minor ST-T wave changes in the inferolateral leads and poor R-wave progression in V1 to V3.  The patient presently denies any chest pain.  The patient denies any recent cardiac workup.  She states she had cardiac cath in 2009, which showed mild coronary artery disease.  PHYSICAL EXAMINATION:  GENERAL:  She was alert, awake, and oriented x3. VITAL SIGNS:  Blood pressure was 162/71, pulse of 60, she was afebrile. Conjunctivae was pink. NECK:  Supple.  No JVD.  No bruit. LUNGS:  Clear to auscultation without rhonchi or rales. CARDIOVASCULAR:  S1, S2 was normal.  There was soft systolic murmur, but there was no S3 or S4 gallop appreciated. ABDOMEN:  Soft, obese, nontender. EXTREMITIES:  There is no clubbing, cyanosis, or edema.  LABORATORY DATA:  Sodium was 139, potassium 3.7, BUN 20, creatinine 1.30.  Repeat BUN was 20, creatinine 1.02.  Glucose was 155.  Repeat fasting blood sugar is 103.  Three sets of troponin-I were negative. Hemoglobin was 12.6, hematocrit 38.9, white count of 10.6.  Lexiscan Myoview scan done today which showed fixed defect within the anterior wall without associated wall motion abnormalities which reversed breast attenuation or scar.  No evidence of inducible ischemia.  EF of 71%.  BRIEF HOSPITAL COURSE:  The patient was admitted to telemetry unit.  MI was ruled out by a serial enzymes and EKG.  The patient subsequently underwent Lexiscan Myoview, which showed no evidence of reversible ischemia with normal EF.  The patient has been ambulating in the room without any problems.  The patient will be discharged home on above medications and will be followed up in my office in 1 week.     Eduardo Osier. Sharyn Lull, M.D.     [facility]  D:  03/06/2013  T:  03/07/2013  Job:  [id number]

## 2013-03-12 LAB — CULTURE, BLOOD (ROUTINE X 2)

## 2013-03-16 ENCOUNTER — Encounter (HOSPITAL_COMMUNITY): Payer: Self-pay

## 2013-03-16 ENCOUNTER — Emergency Department (HOSPITAL_COMMUNITY)
Admission: EM | Admit: 2013-03-16 | Discharge: 2013-03-16 | Disposition: A | Discharge status: Home / Self Care | Payer: Managed Care, Other (non HMO) | Attending: Emergency Medicine | Admitting: Emergency Medicine

## 2013-03-16 DIAGNOSIS — M129 Arthropathy, unspecified: Secondary | ICD-10-CM | POA: Insufficient documentation

## 2013-03-16 DIAGNOSIS — R5383 Other fatigue: Secondary | ICD-10-CM

## 2013-03-16 DIAGNOSIS — I10 Essential (primary) hypertension: Secondary | ICD-10-CM | POA: Insufficient documentation

## 2013-03-16 DIAGNOSIS — R5381 Other malaise: Secondary | ICD-10-CM | POA: Insufficient documentation

## 2013-03-16 DIAGNOSIS — Z860100 Personal history of colon polyps, unspecified: Secondary | ICD-10-CM | POA: Insufficient documentation

## 2013-03-16 DIAGNOSIS — I959 Hypotension, unspecified: Secondary | ICD-10-CM | POA: Insufficient documentation

## 2013-03-16 DIAGNOSIS — R110 Nausea: Secondary | ICD-10-CM | POA: Insufficient documentation

## 2013-03-16 DIAGNOSIS — R55 Syncope and collapse: Secondary | ICD-10-CM | POA: Insufficient documentation

## 2013-03-16 DIAGNOSIS — R630 Anorexia: Secondary | ICD-10-CM | POA: Insufficient documentation

## 2013-03-16 DIAGNOSIS — I4891 Unspecified atrial fibrillation: Secondary | ICD-10-CM | POA: Insufficient documentation

## 2013-03-16 DIAGNOSIS — Z79899 Other long term (current) drug therapy: Secondary | ICD-10-CM | POA: Insufficient documentation

## 2013-03-16 DIAGNOSIS — Z7982 Long term (current) use of aspirin: Secondary | ICD-10-CM | POA: Insufficient documentation

## 2013-03-16 LAB — BASIC METABOLIC PANEL
Calcium: 9.8 mg/dL (ref 8.4–10.5)
Creatinine, Ser: 0.93 mg/dL (ref 0.50–1.10)
GFR calc Af Amer: 81 mL/min — ABNORMAL LOW (ref >90)
Sodium: 135 mEq/L (ref 135–145)

## 2013-03-16 LAB — CBC WITH DIFFERENTIAL/PLATELET
HCT: 38.3 % (ref 36.0–46.0)
Hemoglobin: 13 g/dL (ref 12.0–15.0)
Lymphocytes Relative: 30 % (ref 12–46)
Lymphs Abs: 2.4 10*3/uL (ref 0.7–4.0)
Monocytes Absolute: 0.7 10*3/uL (ref 0.1–1.0)
Monocytes Relative: 8 % (ref 3–12)
Neutro Abs: 4.7 10*3/uL (ref 1.7–7.7)
WBC: 7.8 10*3/uL (ref 4.0–10.5)

## 2013-03-16 LAB — URINALYSIS, ROUTINE W REFLEX MICROSCOPIC
Bilirubin Urine: NEGATIVE
Hgb urine dipstick: NEGATIVE
Nitrite: NEGATIVE
Specific Gravity, Urine: 1.009 (ref 1.005–1.030)
pH: 7.5 (ref 5.0–8.0)

## 2013-03-16 MED ORDER — SODIUM CHLORIDE 0.9 % IV BOLUS (SEPSIS)
1000.0000 mL | Freq: Once | INTRAVENOUS | Status: AC
  Administered 2013-03-16: 1000 mL via INTRAVENOUS

## 2013-03-16 MED ORDER — LORAZEPAM 1 MG PO TABS: 0.5000 mg | ORAL_TABLET | Freq: Once | ORAL | Status: DC

## 2013-03-16 MED ORDER — METOCLOPRAMIDE HCL 5 MG/ML IJ SOLN: 10.0000 mg | Freq: Once | INTRAMUSCULAR | Status: DC

## 2013-03-16 NOTE — ED Notes (Signed)
Pt ambulated to restroom. 

## 2013-03-16 NOTE — ED Notes (Signed)
MD at bedside. 

## 2013-03-16 NOTE — ED Provider Notes (Signed)
History    CSN: [id number] Arrival date & time 03/16/13  1143  First MD Initiated Contact with Patient 03/16/13 1154     Chief Complaint  Patient presents with  . Fatigue  . Near Syncope   (Consider location/radiation/quality/duration/timing/severity/associated sxs/prior Treatment) HPI Comments: Patient with history of recent hospitalization with negative nuclear stress test -- presents today with continued symptoms of generalized fatigue, weakness, lightheadedness with standing, hypotension that she has had since her hospitalization. Patient had a followup appointment with her primary care physician today and was told to come to the emergency department for fluids. Patient describes lack of appetite and states that she has not been eating or drinking much at all. She has forced herself to sip on liquids. She has had nausea but no vomiting. She denies headache, cold symptoms, cough, shortness of breath, abdominal pain, diarrhea, urinary symptoms. Onset of symptoms gradual. Course is constant. No history of thyroid or adrenal problems.  The history is provided by the patient and medical records.   Past Medical History  Diagnosis Date  . Atrial fibrillation   . ASCUS (atypical squamous cells of undetermined significance) on Pap smear 99,04, 4/10  . Colon polyps   . Chest pain 03/05/2013  . Hypertension   . Shortness of breath 03/06/2013    " i had shortness of breath with my chest pain "  . Arthritis    Past Surgical History  Procedure Laterality Date  . Tubal ligation    . Cardiac catheterization  3/09   Family History  Problem Relation Age of Onset  . Hypertension Mother   . Diabetes Mother   . Heart disease Mother   . Heart disease Father   . Cancer Father     stomach  . Heart disease Maternal Grandmother    History  Substance Use Topics  . Smoking status: Never Smoker   . Smokeless tobacco: Never Used  . Alcohol Use: Yes     Comment: rare   OB History   Grav Para  Term Preterm Abortions TAB SAB Ect Mult Living   4 3 3  1  1   3      Review of Systems  All other systems reviewed and are negative.    Allergies  Review of patient's allergies indicates no known allergies.  Home Medications   Current Outpatient Rx  Name  Route  Sig  Dispense  Refill  . aspirin EC 81 MG tablet   Oral   Take 81 mg by mouth daily.         . metoprolol succinate (TOPROL XL) 50 MG 24 hr tablet   Oral   Take 1 tablet (50 mg total) by mouth daily. Take with or immediately following a meal.   30 tablet   3   . olmesartan-hydrochlorothiazide (BENICAR HCT) 20-12.5 MG per tablet   Oral   Take 1 tablet by mouth daily.          BP 115/72  Pulse 75  Temp(Src) 98.2 F (36.8 C) (Oral)  Resp 16  SpO2 100%  LMP 10/02/2012 Physical Exam  Nursing note and vitals reviewed. Constitutional: She appears well-developed and well-nourished.  HENT:  Head: Normocephalic and atraumatic.  Right Ear: Tympanic membrane, external ear and ear canal normal.  Left Ear: Tympanic membrane, external ear and ear canal normal.  Nose: Nose normal.  Mouth/Throat: Uvula is midline and oropharynx is clear and moist. Mucous membranes are dry.  Eyes: Conjunctivae are normal. Right eye exhibits no  discharge. Left eye exhibits no discharge.  Neck: Normal range of motion. Neck supple.  Cardiovascular: Normal rate, regular rhythm and normal heart sounds.   No murmur heard. Pulmonary/Chest: Effort normal and breath sounds normal. No respiratory distress. She has no wheezes. She has no rales.  Abdominal: Soft. Bowel sounds are normal. There is no tenderness. There is no rebound and no guarding.  Musculoskeletal: She exhibits no edema.  Neurological: She is alert.  Skin: Skin is warm and dry.  Psychiatric: She has a normal mood and affect.    ED Course  Procedures (including critical care time) Labs Reviewed  BASIC METABOLIC PANEL - Abnormal; Notable for the following:    Glucose, Bld  121 (*)    GFR calc non Af Amer 70 (*)    GFR calc Af Amer 81 (*)    All other components within normal limits  GLUCOSE, CAPILLARY - Abnormal; Notable for the following:    Glucose-Capillary 127 (*)    All other components within normal limits  CBC WITH DIFFERENTIAL  URINALYSIS, ROUTINE W REFLEX MICROSCOPIC   No results found. 1. Hypotension   2. Malaise and fatigue     1:34 PM Patient seen and examined. Work-up initiated. Medications ordered.   Vital signs reviewed and are as follows: Filed Vitals:   03/16/13 1311  BP: 120/78  Pulse: 87  Temp:   Resp: 20  BP 120/78  Pulse 87  Temp(Src) 98.2 F (36.8 C) (Oral)  Resp 20  SpO2 100%  LMP 10/02/2012    Date: 03/16/2013  Rate: 65  Rhythm: normal sinus rhythm  QRS Axis: normal  Intervals: normal  ST/T Wave abnormalities: normal  Conduction Disutrbances:none  Narrative Interpretation:   Old EKG Reviewed: unchanged from 03/06/2013  5:10 PM Patient d/w and seen by Dr. Rubin Payor. She is feeling better after fluids.   Workup is unremarkable. Patient is going to take one half tablet of her Benicar daily to see if this improves her symptoms and follow up with her primary care physician.  She is urged to return with worsening symptoms or other concerns.  MDM  Fatigue and lightheadedness thought to be secondary to hypotension likely secondary to her blood pressure medications. In the emergency department her labs are unremarkable, EKG is normal and unchanged, blood pressure is reasonable. She is feeling better after 2 L of fluid. Will discharge home with rest and PCP followup.    Renne Crigler, PA-C 03/16/13 1712

## 2013-03-16 NOTE — ED Notes (Signed)
Pt presents with fatigue, generalized weakness x 1 week.  Pt reports near-syncopal episode today, went to Dr. Annitta Jersey office, referred here.

## 2013-03-16 NOTE — ED Notes (Signed)
Pt unable to complete standing for orthostatics due to increased dizziness and generalized weakness

## 2013-03-19 NOTE — ED Provider Notes (Signed)
Medical screening examination/treatment/procedure(s) were performed by non-physician practitioner and as supervising physician I was immediately available for consultation/collaboration.  Avonna Iribe R. Kingstin Heims, MD 03/19/13 1500 

## 2013-09-29 ENCOUNTER — Emergency Department (HOSPITAL_COMMUNITY)
Admission: EM | Admit: 2013-09-29 | Discharge: 2013-09-29 | Disposition: A | Discharge status: Home / Self Care | Payer: 59 | Attending: Emergency Medicine | Admitting: Emergency Medicine

## 2013-09-29 ENCOUNTER — Emergency Department (HOSPITAL_COMMUNITY): Payer: 59

## 2013-09-29 ENCOUNTER — Encounter (HOSPITAL_COMMUNITY): Payer: Self-pay | Admitting: Emergency Medicine

## 2013-09-29 DIAGNOSIS — Z9851 Tubal ligation status: Secondary | ICD-10-CM | POA: Insufficient documentation

## 2013-09-29 DIAGNOSIS — I10 Essential (primary) hypertension: Secondary | ICD-10-CM | POA: Insufficient documentation

## 2013-09-29 DIAGNOSIS — K5732 Diverticulitis of large intestine without perforation or abscess without bleeding: Secondary | ICD-10-CM | POA: Insufficient documentation

## 2013-09-29 DIAGNOSIS — Z860100 Personal history of colon polyps, unspecified: Secondary | ICD-10-CM | POA: Insufficient documentation

## 2013-09-29 DIAGNOSIS — Z95818 Presence of other cardiac implants and grafts: Secondary | ICD-10-CM | POA: Insufficient documentation

## 2013-09-29 DIAGNOSIS — Z79899 Other long term (current) drug therapy: Secondary | ICD-10-CM | POA: Insufficient documentation

## 2013-09-29 DIAGNOSIS — M129 Arthropathy, unspecified: Secondary | ICD-10-CM | POA: Insufficient documentation

## 2013-09-29 DIAGNOSIS — Z7982 Long term (current) use of aspirin: Secondary | ICD-10-CM | POA: Insufficient documentation

## 2013-09-29 DIAGNOSIS — K5792 Diverticulitis of intestine, part unspecified, without perforation or abscess without bleeding: Secondary | ICD-10-CM

## 2013-09-29 LAB — URINALYSIS, ROUTINE W REFLEX MICROSCOPIC
Bilirubin Urine: NEGATIVE
GLUCOSE, UA: NEGATIVE mg/dL
Hgb urine dipstick: NEGATIVE
Ketones, ur: NEGATIVE mg/dL
LEUKOCYTES UA: NEGATIVE
NITRITE: NEGATIVE
PH: 6 (ref 5.0–8.0)
Protein, ur: NEGATIVE mg/dL
Urobilinogen, UA: 0.2 mg/dL (ref 0.0–1.0)

## 2013-09-29 LAB — CBC WITH DIFFERENTIAL/PLATELET
BASOS ABS: 0 10*3/uL (ref 0.0–0.1)
BASOS PCT: 0 % (ref 0–1)
EOS ABS: 0 10*3/uL (ref 0.0–0.7)
Eosinophils Relative: 0 % (ref 0–5)
HCT: 40.4 % (ref 36.0–46.0)
HEMOGLOBIN: 13.2 g/dL (ref 12.0–15.0)
Lymphocytes Relative: 13 % (ref 12–46)
Lymphs Abs: 1.7 10*3/uL (ref 0.7–4.0)
MCH: 29.9 pg (ref 26.0–34.0)
MCHC: 32.7 g/dL (ref 30.0–36.0)
MCV: 91.6 fL (ref 78.0–100.0)
MONO ABS: 0.7 10*3/uL (ref 0.1–1.0)
MONOS PCT: 5 % (ref 3–12)
NEUTROS ABS: 10.6 10*3/uL — AB (ref 1.7–7.7)
NEUTROS PCT: 82 % — AB (ref 43–77)
Platelets: 287 10*3/uL (ref 150–400)
RBC: 4.41 MIL/uL (ref 3.87–5.11)
RDW: 15.5 % (ref 11.5–15.5)
WBC: 12.9 10*3/uL — ABNORMAL HIGH (ref 4.0–10.5)

## 2013-09-29 LAB — COMPREHENSIVE METABOLIC PANEL
ALBUMIN: 4 g/dL (ref 3.5–5.2)
ALT: 14 U/L (ref 0–35)
AST: 15 U/L (ref 0–37)
Alkaline Phosphatase: 112 U/L (ref 39–117)
BUN: 10 mg/dL (ref 6–23)
CO2: 27 mEq/L (ref 19–32)
CREATININE: 0.8 mg/dL (ref 0.50–1.10)
Calcium: 9.7 mg/dL (ref 8.4–10.5)
Chloride: 96 mEq/L (ref 96–112)
GFR calc Af Amer: >90 mL/min (ref >90)
GFR calc non Af Amer: 84 mL/min — ABNORMAL LOW (ref >90)
Glucose, Bld: 97 mg/dL (ref 70–99)
POTASSIUM: 3.8 meq/L (ref 3.7–5.3)
Sodium: 137 mEq/L (ref 137–147)
TOTAL PROTEIN: 8.6 g/dL — AB (ref 6.0–8.3)
Total Bilirubin: 0.9 mg/dL (ref 0.3–1.2)

## 2013-09-29 LAB — POCT I-STAT TROPONIN I: TROPONIN I, POC: 0 ng/mL (ref 0.00–0.08)

## 2013-09-29 LAB — LIPASE, BLOOD: LIPASE: 17 U/L (ref 11–59)

## 2013-09-29 MED ORDER — MORPHINE SULFATE 4 MG/ML IJ SOLN
4.0000 mg | Freq: Once | INTRAMUSCULAR | Status: AC
  Administered 2013-09-29: 4 mg via INTRAVENOUS
  Filled 2013-09-29: qty 1

## 2013-09-29 MED ORDER — METRONIDAZOLE 500 MG PO TABS
500.0000 mg | ORAL_TABLET | Freq: Once | ORAL | Status: AC
  Administered 2013-09-29: 500 mg via ORAL
  Filled 2013-09-29: qty 1

## 2013-09-29 MED ORDER — CIPROFLOXACIN HCL 500 MG PO TABS: 500.0000 mg | ORAL_TABLET | Freq: Two times a day (BID) | ORAL | Status: DC

## 2013-09-29 MED ORDER — ONDANSETRON HCL 4 MG PO TABS
4.0000 mg | ORAL_TABLET | Freq: Four times a day (QID) | ORAL | Status: DC
Start: 2013-09-29 — End: 2013-11-30

## 2013-09-29 MED ORDER — CIPROFLOXACIN HCL 500 MG PO TABS
500.0000 mg | ORAL_TABLET | Freq: Once | ORAL | Status: AC
  Administered 2013-09-29: 500 mg via ORAL
  Filled 2013-09-29: qty 1

## 2013-09-29 MED ORDER — FENTANYL CITRATE 0.05 MG/ML IJ SOLN
50.0000 ug | Freq: Once | INTRAMUSCULAR | Status: AC
  Administered 2013-09-29: 50 ug via INTRAVENOUS
  Filled 2013-09-29: qty 2

## 2013-09-29 MED ORDER — OXYCODONE-ACETAMINOPHEN 5-325 MG PO TABS: 1.0000 | ORAL_TABLET | ORAL | Status: DC | PRN

## 2013-09-29 MED ORDER — IOHEXOL 300 MG/ML  SOLN
100.0000 mL | Freq: Once | INTRAMUSCULAR | Status: AC | PRN
  Administered 2013-09-29: 100 mL via INTRAVENOUS

## 2013-09-29 MED ORDER — METRONIDAZOLE 500 MG PO TABS: 500.0000 mg | ORAL_TABLET | Freq: Three times a day (TID) | ORAL | Status: DC

## 2013-09-29 MED ORDER — IOHEXOL 300 MG/ML  SOLN
25.0000 mL | Freq: Once | INTRAMUSCULAR | Status: AC | PRN
  Administered 2013-09-29: 25 mL via ORAL

## 2013-09-29 MED ORDER — OXYCODONE-ACETAMINOPHEN 5-325 MG PO TABS
1.0000 | ORAL_TABLET | Freq: Once | ORAL | Status: AC
  Administered 2013-09-29: 1 via ORAL
  Filled 2013-09-29: qty 1

## 2013-09-29 MED ORDER — ONDANSETRON HCL 4 MG/2ML IJ SOLN
4.0000 mg | Freq: Once | INTRAMUSCULAR | Status: AC
  Administered 2013-09-29: 4 mg via INTRAVENOUS
  Filled 2013-09-29: qty 2

## 2013-09-29 NOTE — ED Provider Notes (Signed)
Care assumed from Dr. Judd Lienelo.  Ultrasound negative.  Pt with continued pain.  Abd soft, but tender in RUQ primarily, LUQ and RLQ to a lesser extent.  Plan CT.    CT showed diverticulitis.  It also questioned some extraluminal gas, but no signs of macroscopic perforation.  I repeated abdominal exams during her ED course, and her abdomen remained benign.   She remained well appearing and nontoxic.  Vital signs remained stable.  I don't think she requires inpatient management, but I did give her strict return precautions in light of this extra finding.  She appeared reliable and understood these instructions.  DC'd with cipro and flagyl.  Clinical Impression: 1. Diverticulitis       Elizabeth ChurnJohn David Elizabeth Deblois, MD 09/30/13 [phone]

## 2013-09-29 NOTE — ED Provider Notes (Signed)
CSN: [id number]     Arrival date & time 09/29/13  1330 History   First MD Initiated Contact with Patient 09/29/13 1351     Chief Complaint  Patient presents with  . Abdominal Pain   (Consider location/radiation/quality/duration/timing/severity/associated sxs/prior Treatment) HPI Comments: Patient is a 53 year old female history of atrial fibrillation and hypertension. She presents today with complaints of Epigastric and right upper quadrant discomfort that started 3 days ago. It became significantly worse this morning. She denies any fevers or chills. She denies any vomiting or diarrhea. She has never had a pain like this before and denies any prior abdominal surgery.  Patient is a 53 y.o. female presenting with abdominal pain. The history is provided by the patient.  Abdominal Pain Pain location:  Epigastric and RUQ Pain quality: cramping   Pain radiates to:  Does not radiate Pain severity:  Severe Onset quality:  Gradual Duration:  3 days Timing:  Constant Progression:  Worsening Chronicity:  New Context: not diet changes, not sick contacts and not trauma   Relieved by:  Nothing Worsened by:  Movement and palpation Ineffective treatments:  None tried Associated symptoms: no chest pain, no chills, no constipation and no diarrhea     Past Medical History  Diagnosis Date  . Atrial fibrillation   . ASCUS (atypical squamous cells of undetermined significance) on Pap smear 99,04, 4/10  . Colon polyps   . Chest pain 03/05/2013  . Hypertension   . Shortness of breath 03/06/2013    " i had shortness of breath with my chest pain "  . Arthritis    Past Surgical History  Procedure Laterality Date  . Tubal ligation    . Cardiac catheterization  3/09   Family History  Problem Relation Age of Onset  . Hypertension Mother   . Diabetes Mother   . Heart disease Mother   . Heart disease Father   . Cancer Father     stomach  . Heart disease Maternal Grandmother    History   Substance Use Topics  . Smoking status: Never Smoker   . Smokeless tobacco: Never Used  . Alcohol Use: Yes     Comment: rare   OB History   Grav Para Term Preterm Abortions TAB SAB Ect Mult Living   4 3 3  1  1   3      Review of Systems  Constitutional: Negative for chills.  Cardiovascular: Negative for chest pain.  Gastrointestinal: Positive for abdominal pain. Negative for diarrhea and constipation.  All other systems reviewed and are negative.    Allergies  Review of patient's allergies indicates no known allergies.  Home Medications   Current Outpatient Rx  Name  Route  Sig  Dispense  Refill  . aspirin EC 81 MG tablet   Oral   Take 81 mg by mouth daily.         . metoprolol succinate (TOPROL XL) 50 MG 24 hr tablet   Oral   Take 1 tablet (50 mg total) by mouth daily. Take with or immediately following a meal.   30 tablet   3   . olmesartan-hydrochlorothiazide (BENICAR HCT) 20-12.5 MG per tablet   Oral   Take 1 tablet by mouth daily.          BP 117/74  Pulse 87  Temp(Src) 98.6 F (37 C) (Oral)  Resp 16  SpO2 96%  LMP 10/02/2012 Physical Exam  Nursing note and vitals reviewed. Constitutional: She is oriented to person, place,  and time. She appears well-developed and well-nourished. No distress.  HENT:  Head: Normocephalic and atraumatic.  Neck: Normal range of motion. Neck supple.  Cardiovascular: Normal rate and regular rhythm.  Exam reveals no gallop and no friction rub.   No murmur heard. Pulmonary/Chest: Effort normal and breath sounds normal. No respiratory distress. She has no wheezes.  Abdominal: Soft. Bowel sounds are normal. She exhibits no distension. There is tenderness. There is no rebound and no guarding.  Musculoskeletal: Normal range of motion.  Neurological: She is alert and oriented to person, place, and time.  Skin: Skin is warm and dry. She is not diaphoretic.    ED Course  Procedures (including critical care time) Labs  Review Labs Reviewed  CBC WITH DIFFERENTIAL  COMPREHENSIVE METABOLIC PANEL  LIPASE, BLOOD  URINALYSIS, ROUTINE W REFLEX MICROSCOPIC   Imaging Review No results found.    MDM  No diagnosis found. Patient is a 53 year old female who presents with complaints of right upper quadrant abdominal pain that started earlier this morning. Laboratory studies revealed a mildly elevated white count but normal liver function and lipase. Patient was given pain medication and will be having an ultrasound performed to rule out cholelithiasis. This patient's care will be signed out at shift change to Dr. Loretha Stapler who will obtain the results of this study and determine the final disposition.    Geoffery Lyons, MD 09/30/13 [phone]

## 2013-09-29 NOTE — Discharge Instructions (Signed)
Diverticulitis °A diverticulum is a small pouch or sac on the colon. Diverticulosis is the presence of these diverticula on the colon. Diverticulitis is the irritation (inflammation) or infection of diverticula. °CAUSES  °The colon and its diverticula contain bacteria. If food particles block the tiny opening to a diverticulum, the bacteria inside can grow and cause an increase in pressure. This leads to infection and inflammation and is called diverticulitis. °SYMPTOMS  °· Abdominal pain and tenderness. Usually, the pain is located on the left side of your abdomen. However, it could be located elsewhere. °· Fever. °· Bloating. °· Feeling sick to your stomach (nausea). °· Throwing up (vomiting). °· Abnormal stools. °DIAGNOSIS  °Your caregiver will take a history and perform a physical exam. Since many things can cause abdominal pain, other tests may be necessary. Tests may include: °· Blood tests. °· Urine tests. °· X-ray of the abdomen. °· CT scan of the abdomen. °Sometimes, surgery is needed to determine if diverticulitis or other conditions are causing your symptoms. °TREATMENT  °Most of the time, you can be treated without surgery. Treatment includes: °· Resting the bowels by only having liquids for a few days. As you improve, you will need to eat a low-fiber diet. °· Intravenous (IV) fluids if you are losing body fluids (dehydrated). °· Antibiotic medicines that treat infections may be given. °· Pain and nausea medicine, if needed. °· Surgery if the inflamed diverticulum has burst. °HOME CARE INSTRUCTIONS  °· Try a clear liquid diet (broth, tea, or water for as long as directed by your caregiver). You may then gradually begin a low-fiber diet as tolerated.  °A low-fiber diet is a diet with less than 10 grams of fiber. Choose the foods below to reduce fiber in the diet: °· White breads, cereals, rice, and pasta. °· Cooked fruits and vegetables or soft fresh fruits and vegetables without the skin. °· Ground or  well-cooked tender beef, ham, veal, lamb, pork, or poultry. °· Eggs and seafood. °· After your diverticulitis symptoms have improved, your caregiver may put you on a high-fiber diet. A high-fiber diet includes 14 grams of fiber for every 1000 calories consumed. For a standard 2000 calorie diet, you would need 28 grams of fiber. Follow these diet guidelines to help you increase the fiber in your diet. It is important to slowly increase the amount fiber in your diet to avoid gas, constipation, and bloating. °· Choose whole-grain breads, cereals, pasta, and brown rice. °· Choose fresh fruits and vegetables with the skin on. Do not overcook vegetables because the more vegetables are cooked, the more fiber is lost. °· Choose more nuts, seeds, legumes, dried peas, beans, and lentils. °· Look for food products that have greater than 3 grams of fiber per serving on the Nutrition Facts label. °· Take all medicine as directed by your caregiver. °· If your caregiver has given you a follow-up appointment, it is very important that you go. Not going could result in lasting (chronic) or permanent injury, pain, and disability. If there is any problem keeping the appointment, call to reschedule. °SEEK MEDICAL CARE IF:  °· Your pain does not improve. °· You have a hard time advancing your diet beyond clear liquids. °· Your bowel movements do not return to normal. °SEEK IMMEDIATE MEDICAL CARE IF:  °· Your pain becomes worse. °· You have an oral temperature above 102° F (38.9° C), not controlled by medicine. °· You have repeated vomiting. °· You have bloody or black, tarry stools. °·   Symptoms that brought you to your caregiver become worse or are not getting better. °MAKE SURE YOU:  °· Understand these instructions. °· Will watch your condition. °· Will get help right away if you are not doing well or get worse. °Document Released: 06/16/2005 Document Revised: 11/29/2011 Document Reviewed: 10/12/2010 °[employer]® Patient Information  ©2014 [employer]. ° °

## 2013-09-29 NOTE — ED Notes (Signed)
Spoke with US, pt second in line

## 2013-09-29 NOTE — ED Notes (Signed)
Pt transported to CT ?

## 2013-09-29 NOTE — ED Notes (Signed)
Pt reports  Left sided abdominal pain x3 days and tenderness. Pt denies n/v or fever. Pt states that her LBM was yesterday, reports that it was normal consistency. Pt  states that she was unable to take morning medication and has not tried any pain medication.

## 2013-09-29 NOTE — ED Notes (Signed)
PO contrast finished. Notified CT.

## 2013-11-30 ENCOUNTER — Encounter: Payer: Self-pay | Admitting: Gynecology

## 2013-11-30 ENCOUNTER — Other Ambulatory Visit (HOSPITAL_COMMUNITY)
Admission: RE | Admit: 2013-11-30 | Discharge: 2013-11-30 | Disposition: A | Discharge status: Home / Self Care | Payer: 59 | Source: Ambulatory Visit | Attending: Gynecology | Admitting: Gynecology

## 2013-11-30 ENCOUNTER — Ambulatory Visit (INDEPENDENT_AMBULATORY_CARE_PROVIDER_SITE_OTHER): Payer: 59 | Admitting: Gynecology

## 2013-11-30 VITALS — BP 124/80 | Ht 64.0 in | Wt 217.0 lb

## 2013-11-30 DIAGNOSIS — Z01419 Encounter for gynecological examination (general) (routine) without abnormal findings: Secondary | ICD-10-CM

## 2013-11-30 DIAGNOSIS — N926 Irregular menstruation, unspecified: Secondary | ICD-10-CM

## 2013-11-30 DIAGNOSIS — Z1151 Encounter for screening for human papillomavirus (HPV): Secondary | ICD-10-CM | POA: Insufficient documentation

## 2013-11-30 NOTE — Addendum Note (Signed)
Addended by: Dayna BarkerGARDNER, Domani Bakos K on: 11/30/2013 04:32 PM   Modules accepted: Orders

## 2013-11-30 NOTE — Progress Notes (Signed)
Aldine Contesonya C Madrazo [birth year] [id number]        53 y.o.  [id number] for annual exam.  Several issues noted below.  Past medical history,surgical history, problem list, medications, allergies, family history and social history were all reviewed and documented in the EPIC chart.  ROS:  Performed and pertinent positives and negatives are included in the history, assessment and plan .  Exam: Kim assistant Filed Vitals:   11/30/13 1552  BP: 124/80  Height: 5\' 4"  (1.626 m)  Weight: 217 lb (98.431 kg)   General appearance  Normal Skin grossly normal Head/Neck normal with no cervical or supraclavicular adenopathy thyroid normal Lungs  clear Cardiac RR, without RMG Abdominal  soft, nontender, without masses, organomegaly or hernia Breasts  examined lying and sitting without masses, retractions, discharge or axillary adenopathy. Pelvic  Ext/BUS/vagina normal  Cervix normal  Uterus anteverted, normal size, shape and contour, midline and mobile nontender   Adnexa  Without masses or tenderness    Anus and perineum  Normal   Rectovaginal  Normal sphincter tone without palpated masses or tenderness.    Assessment/Plan:  53 y.o. [id number] female for annual exam irregular menses, tubal sterilization.   1. Irregular menses/menopausal symptoms. Patient is having light menses now every 4-6 months. Starting to have hot flashes and some night sweats. Reviewed options with her up to and including HRT. At this point patient prefers to observe and try over-the-counter soy-based products. Will keep menstrual calendar and as long as less treatment but regular menses then we'll follow. If prolonged or atypical bleeding or she goes more than one year without menses and then believes she knows to call me. If menopausal symptoms seemed to worsen and she wants to rediscuss HRT and she'll represent for discussion. 2. Pap smear 2013. Pap/HPV done today. 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 Several normal Pap smears  since. Assuming Pap smear normal implant 3-5 year followup Pap smears. 3. Mammography due now the patient knows to schedule. SBE monthly reviewed. 4. Colonoscopy due now she is 5 years out with polyp history. Patient knows to schedule it agrees to do so. 5. Health maintenance. Reports recent blood work through her primary physician's office. No blood work done today. Followup one year, sooner as needed.   Note: This document was prepared with digital dictation and possible smart phrase technology. Any transcriptional errors that result from this process are unintentional.   Dara LordsFONTAINE,Ademola Vert P MD, 4:24 PM 11/30/2013

## 2013-11-30 NOTE — Patient Instructions (Signed)
Followup in 1 year for annual exam. Followup sooner if you have a typical bleeding or if you're menopausal symptoms worsen you want to discuss hormone replacement.   Health Maintenance, Female A healthy lifestyle and preventative care can promote health and wellness.  Maintain regular health, dental, and eye exams.  Eat a healthy diet. Foods like vegetables, fruits, whole grains, low-fat dairy products, and lean protein foods contain the nutrients you need without too many calories. Decrease your intake of foods high in solid fats, added sugars, and salt. Get information about a proper diet from your caregiver, if necessary.  Regular physical exercise is one of the most important things you can do for your health. Most adults should get at least 150 minutes of moderate-intensity exercise (any activity that increases your heart rate and causes you to sweat) each week. In addition, most adults need muscle-strengthening exercises on 2 or more days a week.   Maintain a healthy weight. The body mass index (BMI) is a screening tool to identify possible weight problems. It provides an estimate of body fat based on height and weight. Your caregiver can help determine your BMI, and can help you achieve or maintain a healthy weight. For adults 20 years and older:  A BMI below 18.5 is considered underweight.  A BMI of 18.5 to 24.9 is normal.  A BMI of 25 to 29.9 is considered overweight.  A BMI of 30 and above is considered obese.  Maintain normal blood lipids and cholesterol by exercising and minimizing your intake of saturated fat. Eat a balanced diet with plenty of fruits and vegetables. Blood tests for lipids and cholesterol should begin at age 11 and be repeated every 5 years. If your lipid or cholesterol levels are high, you are over 50, or you are a high risk for heart disease, you may need your cholesterol levels checked more frequently.Ongoing high lipid and cholesterol levels should be  treated with medicines if diet and exercise are not effective.  If you smoke, find out from your caregiver how to quit. If you do not use tobacco, do not start.  Lung cancer screening is recommended for adults aged 38 80 years who are at high risk for developing lung cancer because of a history of smoking. Yearly low-dose computed tomography (CT) is recommended for people who have at least a 30-pack-year history of smoking and are a current smoker or have quit within the past 15 years. A pack year of smoking is smoking an average of 1 pack of cigarettes a day for 1 year (for example: 1 pack a day for 30 years or 2 packs a day for 15 years). Yearly screening should continue until the smoker has stopped smoking for at least 15 years. Yearly screening should also be stopped for people who develop a health problem that would prevent them from having lung cancer treatment.  If you are pregnant, do not drink alcohol. If you are breastfeeding, be very cautious about drinking alcohol. If you are not pregnant and choose to drink alcohol, do not exceed 1 drink per day. One drink is considered to be 12 ounces (355 mL) of beer, 5 ounces (148 mL) of wine, or 1.5 ounces (44 mL) of liquor.  Avoid use of street drugs. Do not share needles with anyone. Ask for help if you need support or instructions about stopping the use of drugs.  High blood pressure causes heart disease and increases the risk of stroke. Blood pressure should be checked at  least every 1 to 2 years. Ongoing high blood pressure should be treated with medicines, if weight loss and exercise are not effective.  If you are 70 to 52 years old, ask your caregiver if you should take aspirin to prevent strokes.  Diabetes screening involves taking a blood sample to check your fasting blood sugar level. This should be done once every 3 years, after age 77, if you are within normal weight and without risk factors for diabetes. Testing should be considered at a  younger age or be carried out more frequently if you are overweight and have at least 1 risk factor for diabetes.  Breast cancer screening is essential preventative care for women. You should practice "breast self-awareness." This means understanding the normal appearance and feel of your breasts and may include breast self-examination. Any changes detected, no matter how small, should be reported to a caregiver. Women in their 41s and 30s should have a clinical breast exam (CBE) by a caregiver as part of a regular health exam every 1 to 3 years. After age 73, women should have a CBE every year. Starting at age 58, women should consider having a mammogram (breast X-ray) every year. Women who have a family history of breast cancer should talk to their caregiver about genetic screening. Women at a high risk of breast cancer should talk to their caregiver about having an MRI and a mammogram every year.  Breast cancer gene (BRCA)-related cancer risk assessment is recommended for women who have family members with BRCA-related cancers. BRCA-related cancers include breast, ovarian, tubal, and peritoneal cancers. Having family members with these cancers may be associated with an increased risk for harmful changes (mutations) in the breast cancer genes BRCA1 and BRCA2. Results of the assessment will determine the need for genetic counseling and BRCA1 and BRCA2 testing.  The Pap test is a screening test for cervical cancer. Women should have a Pap test starting at age 50. Between ages 36 and 58, Pap tests should be repeated every 2 years. Beginning at age 43, you should have a Pap test every 3 years as long as the past 3 Pap tests have been normal. If you had a hysterectomy for a problem that was not cancer or a condition that could lead to cancer, then you no longer need Pap tests. If you are between ages 59 and 71, and you have had normal Pap tests going back 10 years, you no longer need Pap tests. If you have had  past treatment for cervical cancer or a condition that could lead to cancer, you need Pap tests and screening for cancer for at least 20 years after your treatment. If Pap tests have been discontinued, risk factors (such as a new sexual partner) need to be reassessed to determine if screening should be resumed. Some women have medical problems that increase the chance of getting cervical cancer. In these cases, your caregiver may recommend more frequent screening and Pap tests.  The human papillomavirus (HPV) test is an additional test that may be used for cervical cancer screening. The HPV test looks for the virus that can cause the cell changes on the cervix. The cells collected during the Pap test can be tested for HPV. The HPV test could be used to screen women aged 38 years and older, and should be used in women of any age who have unclear Pap test results. After the age of 57, women should have HPV testing at the same frequency as a Pap  test.  Colorectal cancer can be detected and often prevented. Most routine colorectal cancer screening begins at the age of 37 and continues through age 93. However, your caregiver may recommend screening at an earlier age if you have risk factors for colon cancer. On a yearly basis, your caregiver may provide home test kits to check for hidden blood in the stool. Use of a small camera at the end of a tube, to directly examine the colon (sigmoidoscopy or colonoscopy), can detect the earliest forms of colorectal cancer. Talk to your caregiver about this at age 85, when routine screening begins. Direct examination of the colon should be repeated every 5 to 10 years through age 6, unless early forms of pre-cancerous polyps or small growths are found.  Hepatitis C blood testing is recommended for all people born from [birth year] through 1964 and any individual with known risks for hepatitis C.  Practice safe sex. Use condoms and avoid high-risk sexual practices to reduce the  spread of sexually transmitted infections (STIs). Sexually active women aged 41 and younger should be checked for Chlamydia, which is a common sexually transmitted infection. Older women with new or multiple partners should also be tested for Chlamydia. Testing for other STIs is recommended if you are sexually active and at increased risk.  Osteoporosis is a disease in which the bones lose minerals and strength with aging. This can result in serious bone fractures. The risk of osteoporosis can be identified using a bone density scan. Women ages 91 and over and women at risk for fractures or osteoporosis should discuss screening with their caregivers. Ask your caregiver whether you should be taking a calcium supplement or vitamin D to reduce the rate of osteoporosis.  Menopause can be associated with physical symptoms and risks. Hormone replacement therapy is available to decrease symptoms and risks. You should talk to your caregiver about whether hormone replacement therapy is right for you.  Use sunscreen. Apply sunscreen liberally and repeatedly throughout the day. You should seek shade when your shadow is shorter than you. Protect yourself by wearing long sleeves, pants, a wide-brimmed hat, and sunglasses year round, whenever you are outdoors.  Notify your caregiver of new moles or changes in moles, especially if there is a change in shape or color. Also notify your caregiver if a mole is larger than the size of a pencil eraser.  Stay current with your immunizations. Document Released: 03/22/2011 Document Revised: 01/01/2013 Document Reviewed: 03/22/2011 [facility] Patient Information 2014 [place].

## 2013-12-01 LAB — URINALYSIS W MICROSCOPIC + REFLEX CULTURE
BACTERIA UA: NONE SEEN
BILIRUBIN URINE: NEGATIVE
Casts: NONE SEEN
Crystals: NONE SEEN
GLUCOSE, UA: NEGATIVE mg/dL
HGB URINE DIPSTICK: NEGATIVE
KETONES UR: NEGATIVE mg/dL
Leukocytes, UA: NEGATIVE
Nitrite: NEGATIVE
PROTEIN: NEGATIVE mg/dL
Specific Gravity, Urine: 1.025 (ref 1.005–1.030)
UROBILINOGEN UA: 0.2 mg/dL (ref 0.0–1.0)
pH: 6.5 (ref 5.0–8.0)

## 2014-02-24 ENCOUNTER — Ambulatory Visit (INDEPENDENT_AMBULATORY_CARE_PROVIDER_SITE_OTHER): Payer: 59 | Admitting: Family Medicine

## 2014-02-24 ENCOUNTER — Ambulatory Visit: Payer: 59

## 2014-02-24 VITALS — BP 132/86 | HR 85 | Temp 97.9°F | Resp 16 | Ht 65.0 in | Wt 220.8 lb

## 2014-02-24 DIAGNOSIS — M79675 Pain in left toe(s): Secondary | ICD-10-CM

## 2014-02-24 DIAGNOSIS — M79609 Pain in unspecified limb: Secondary | ICD-10-CM

## 2014-02-24 DIAGNOSIS — M5430 Sciatica, unspecified side: Secondary | ICD-10-CM

## 2014-02-24 DIAGNOSIS — S90129A Contusion of unspecified lesser toe(s) without damage to nail, initial encounter: Secondary | ICD-10-CM

## 2014-02-24 NOTE — Progress Notes (Addendum)
Subjective: 53 year old lady who was chasing a child yesterday and turned her left toes under. She injured the distal part of the left third toe which is very painful. She has had previous bunionectomy.  Objective: Pulse good. Toes have adequate movement. She has ecchymosis in the distal phalanx of the third toe which is tender. The other toes are nontender.  Assessment: Toe pain and injury, rule out fracture  Plan: X-ray left third  [facility] reading (PRIMARY) by  Dr. Alwyn Ren No fracture noted  Conservative treatment.  Radiologist reads this as poss. Subtile proximal fx, however that is not where she is tender and I do not believe it is indeed a fracture there.

## 2014-02-24 NOTE — Patient Instructions (Addendum)
Apply ice several times daily  Buddy tape toes  Wear stiff soled shoe  Aleve or ibuprofen if needed  Return if problems

## 2014-04-04 ENCOUNTER — Other Ambulatory Visit: Payer: Self-pay | Admitting: Gastroenterology

## 2014-08-21 ENCOUNTER — Encounter: Payer: Self-pay | Admitting: Gynecology

## 2014-10-21 ENCOUNTER — Other Ambulatory Visit (HOSPITAL_COMMUNITY): Payer: Self-pay | Admitting: Orthopedic Surgery

## 2014-10-21 DIAGNOSIS — M25572 Pain in left ankle and joints of left foot: Secondary | ICD-10-CM

## 2014-11-06 ENCOUNTER — Encounter (INDEPENDENT_AMBULATORY_CARE_PROVIDER_SITE_OTHER): Payer: Self-pay

## 2014-11-06 ENCOUNTER — Ambulatory Visit (HOSPITAL_COMMUNITY)
Admission: RE | Admit: 2014-11-06 | Discharge: 2014-11-06 | Disposition: A | Discharge status: Home / Self Care | Payer: 59 | Source: Ambulatory Visit | Attending: Orthopedic Surgery | Admitting: Orthopedic Surgery

## 2014-11-06 DIAGNOSIS — M25472 Effusion, left ankle: Secondary | ICD-10-CM | POA: Insufficient documentation

## 2014-11-06 DIAGNOSIS — M722 Plantar fascial fibromatosis: Secondary | ICD-10-CM | POA: Diagnosis not present

## 2014-11-06 DIAGNOSIS — M25572 Pain in left ankle and joints of left foot: Secondary | ICD-10-CM

## 2015-07-11 ENCOUNTER — Other Ambulatory Visit: Payer: Self-pay | Admitting: Orthopedic Surgery

## 2015-07-11 DIAGNOSIS — M25551 Pain in right hip: Secondary | ICD-10-CM

## 2015-07-25 ENCOUNTER — Ambulatory Visit
Admission: RE | Admit: 2015-07-25 | Discharge: 2015-07-25 | Disposition: A | Discharge status: Home / Self Care | Payer: 59 | Source: Ambulatory Visit | Attending: Orthopedic Surgery | Admitting: Orthopedic Surgery

## 2015-07-25 DIAGNOSIS — M25551 Pain in right hip: Secondary | ICD-10-CM

## 2015-07-25 MED ORDER — METHYLPREDNISOLONE ACETATE 40 MG/ML INJ SUSP (RADIOLOG
120.0000 mg | Freq: Once | INTRAMUSCULAR | Status: AC
  Administered 2015-07-25: 120 mg via INTRA_ARTICULAR

## 2015-07-25 MED ORDER — IOHEXOL 180 MG/ML  SOLN
1.0000 mL | Freq: Once | INTRAMUSCULAR | Status: DC | PRN
  Administered 2015-07-25: 1 mL via INTRA_ARTICULAR

## 2015-09-24 DIAGNOSIS — Z1231 Encounter for screening mammogram for malignant neoplasm of breast: Secondary | ICD-10-CM | POA: Diagnosis not present

## 2015-09-30 ENCOUNTER — Encounter: Payer: Self-pay | Admitting: Gynecology

## 2015-10-10 MED FILL — DOXEPIN 10 MG CAPSULE: 10 | 30 days supply | Qty: 60 | Fill #2

## 2015-12-05 MED FILL — LISINOPRIL-HCTZ 20-12.5 MG: 20-12.5 | 90 days supply | Qty: 90 | Fill #2

## 2015-12-05 MED FILL — ATORVASTATIN 10 MG TABLET: 10 | 90 days supply | Qty: 90 | Fill #2

## 2015-12-26 DIAGNOSIS — E7800 Pure hypercholesterolemia, unspecified: Secondary | ICD-10-CM | POA: Diagnosis not present

## 2015-12-26 DIAGNOSIS — R7309 Other abnormal glucose: Secondary | ICD-10-CM | POA: Diagnosis not present

## 2015-12-26 DIAGNOSIS — I10 Essential (primary) hypertension: Secondary | ICD-10-CM | POA: Diagnosis not present

## 2015-12-26 DIAGNOSIS — M25551 Pain in right hip: Secondary | ICD-10-CM | POA: Diagnosis not present

## 2015-12-26 DIAGNOSIS — G479 Sleep disorder, unspecified: Secondary | ICD-10-CM | POA: Diagnosis not present

## 2015-12-26 DIAGNOSIS — R232 Flushing: Secondary | ICD-10-CM | POA: Diagnosis not present

## 2015-12-31 DIAGNOSIS — M47816 Spondylosis without myelopathy or radiculopathy, lumbar region: Secondary | ICD-10-CM | POA: Diagnosis not present

## 2015-12-31 MED FILL — CYCLOBENZAPRINE 10 MG TAB: 10 | 30 days supply | Qty: 60 | Fill #0

## 2016-01-12 MED FILL — BENZONATATE 200 MG CAPSULE: 200 | 10 days supply | Qty: 30 | Fill #0

## 2016-01-14 MED FILL — CHERATUSSIN AC SYRUP: 100-10 | 3 days supply | Qty: 120 | Fill #0

## 2016-02-25 ENCOUNTER — Ambulatory Visit (INDEPENDENT_AMBULATORY_CARE_PROVIDER_SITE_OTHER): Payer: 59 | Admitting: Gynecology

## 2016-02-25 ENCOUNTER — Encounter: Payer: Self-pay | Admitting: Gynecology

## 2016-02-25 VITALS — BP 124/78 | Ht 65.0 in | Wt 217.0 lb

## 2016-02-25 DIAGNOSIS — Z01419 Encounter for gynecological examination (general) (routine) without abnormal findings: Secondary | ICD-10-CM

## 2016-02-25 DIAGNOSIS — N950 Postmenopausal bleeding: Secondary | ICD-10-CM

## 2016-02-25 NOTE — Progress Notes (Signed)
    Elizabeth [name] [birth year] [id number]        55 y.o.  [id number]  for annual exam.  Several issues noted below.   Past medical history,surgical history, problem list, medications, allergies, family history and social history were all reviewed and documented as reviewed in the EPIC chart.  ROS:  Performed with pertinent positives and negatives included in the history, assessment and plan.   Additional significant findings :none  Exam: Kennon PortelaKim Gardner assistant Filed Vitals:   02/25/16 1540  BP: 124/78  Height: 5\' 5"  (1.651 m)  Weight: 217 lb (98.431 kg)   General appearance:  Normal affect, orientation and appearance. Skin: Grossly normal HEENT: Without gross lesions.  No cervical or supraclavicular adenopathy. Thyroid normal.  Lungs:  Clear without wheezing, rales or rhonchi Cardiac: RR, without RMG Abdominal:  Soft, nontender, without masses, guarding, rebound, organomegaly or hernia Breasts:  Examined lying and sitting without masses, retractions, discharge or axillary adenopathy. Pelvic:  Ext/BUS/vagina normal   Cervix normal   Uterus anteverted , normal size, shape and contour, midline and mobile nontender   Adnexa without masses or tenderness    Anus and perineum normal   Rectovaginal normal sphincter tone without palpated masses or tenderness.    Assessment/Plan:  55 y.o. [id number] female for annual exam, BTL.   1. Postmenopausal bleeding. Patient had single episode of light bleeding this past month lasting approximately 5 days after 2 years of no menses. No pain or other associated symptoms. Recommend sonohysterogram rule out structural such as polyps or submucous myomas as well as hyperplastic/precancerous changes.  I reviewed with the patient was involved with the procedure as well as the endometrial biopsy part. Patient will schedule follow up for this. 2. Mammography 09/2015. Continue with annual mammography when due. SBE monthly reviewed. 3. Pap smear/HPV 2015 negative. No  Pap smear done today. No history of significant abnormal Pap smears.  Does have history of ASCUS negative high-risk HPV 2010. 4. Colonoscopy 2015. Repeat at their recommended interval. 5. Health maintenance. No routine lab work done as patient reports this done at her primary physician's office. Follow up for sonohysterogram otherwise annual exam in one year.   Dara LordsFONTAINE,Keiondre Colee P MD, 5:00 PM 02/25/2016

## 2016-02-25 NOTE — Patient Instructions (Signed)
Follow up for ultrasound as scheduled 

## 2016-02-26 ENCOUNTER — Other Ambulatory Visit: Payer: Self-pay | Admitting: Gynecology

## 2016-02-26 DIAGNOSIS — N950 Postmenopausal bleeding: Secondary | ICD-10-CM

## 2016-03-04 ENCOUNTER — Telehealth: Payer: Self-pay | Admitting: Gynecology

## 2016-03-04 NOTE — Telephone Encounter (Signed)
03/04/16-I LM VM home for pt that her [facility] ins has a 20% coins on the sonohysterogram allowable of $178.65. Bx is $55 additional. Pt to call if this is a problem. Per [email or web address]#[id number] wl

## 2016-03-10 MED FILL — LISINOPRIL-HCTZ 20-12.5 MG: 20-12.5 | 90 days supply | Qty: 90 | Fill #0

## 2016-03-10 MED FILL — ATORVASTATIN 10 MG TABLET: 10 | 90 days supply | Qty: 90 | Fill #0

## 2016-03-24 ENCOUNTER — Other Ambulatory Visit: Payer: 59

## 2016-03-24 ENCOUNTER — Ambulatory Visit: Payer: 59 | Admitting: Gynecology

## 2016-06-17 MED FILL — ATORVASTATIN 10 MG TABLET: 10 | 90 days supply | Qty: 90 | Fill #1

## 2016-06-17 MED FILL — LISINOPRIL-HCTZ 20-12.5 MG: 20-12.5 | 90 days supply | Qty: 90 | Fill #1

## 2016-06-25 DIAGNOSIS — G479 Sleep disorder, unspecified: Secondary | ICD-10-CM | POA: Diagnosis not present

## 2016-06-25 DIAGNOSIS — I10 Essential (primary) hypertension: Secondary | ICD-10-CM | POA: Diagnosis not present

## 2016-06-25 DIAGNOSIS — R7309 Other abnormal glucose: Secondary | ICD-10-CM | POA: Diagnosis not present

## 2016-06-25 DIAGNOSIS — E669 Obesity, unspecified: Secondary | ICD-10-CM | POA: Diagnosis not present

## 2016-06-25 DIAGNOSIS — M25551 Pain in right hip: Secondary | ICD-10-CM | POA: Diagnosis not present

## 2016-06-25 DIAGNOSIS — E7800 Pure hypercholesterolemia, unspecified: Secondary | ICD-10-CM | POA: Diagnosis not present

## 2016-06-25 DIAGNOSIS — Z23 Encounter for immunization: Secondary | ICD-10-CM | POA: Diagnosis not present

## 2016-06-25 MED FILL — DICLOFENAC SOD 75 MG TAB EC: 75 | 30 days supply | Qty: 60 | Fill #0

## 2016-06-25 MED FILL — DOXEPIN 10 MG CAPSULE: 10 | 30 days supply | Qty: 60 | Fill #0

## 2016-09-23 ENCOUNTER — Encounter: Payer: Self-pay | Admitting: Gynecology

## 2016-09-23 DIAGNOSIS — Z1231 Encounter for screening mammogram for malignant neoplasm of breast: Secondary | ICD-10-CM | POA: Diagnosis not present

## 2016-09-23 MED FILL — LISINOPRIL-HCTZ 20-12.5 MG: 20-12.5 | 90 days supply | Qty: 90 | Fill #0

## 2016-09-23 MED FILL — ATORVASTATIN 10 MG TABLET: 10 | 90 days supply | Qty: 90 | Fill #0

## 2017-01-03 MED FILL — ATORVASTATIN 10 MG TABLET: 10 | 90 days supply | Qty: 90 | Fill #1

## 2017-01-03 MED FILL — LISINOPRIL-HCTZ 20-12.5 MG: 20-12.5 | 90 days supply | Qty: 90 | Fill #1

## 2017-02-02 MED FILL — DOXYCYCLINE HYCLATE 100 MG: 100 | 10 days supply | Qty: 20 | Fill #0

## 2017-03-08 ENCOUNTER — Encounter: Payer: Self-pay | Admitting: Gynecology

## 2017-03-08 ENCOUNTER — Ambulatory Visit (INDEPENDENT_AMBULATORY_CARE_PROVIDER_SITE_OTHER): Payer: 59 | Admitting: Gynecology

## 2017-03-08 VITALS — BP 124/76 | Ht 65.0 in | Wt 224.0 lb

## 2017-03-08 DIAGNOSIS — Z01419 Encounter for gynecological examination (general) (routine) without abnormal findings: Secondary | ICD-10-CM | POA: Diagnosis not present

## 2017-03-08 NOTE — Patient Instructions (Signed)
Follow up in one year for annual exam 

## 2017-03-08 NOTE — Progress Notes (Signed)
,      Elizabeth [name] 1961/05/19 [id number]        56 y.o.  [id number] for annual exam.    Past medical history,surgical history, problem list, medications, allergies, family history and social history were all reviewed and documented as reviewed in the EPIC chart.  ROS:  Performed with pertinent positives and negatives included in the history, assessment and plan.   Additional significant findings :  None   Exam: Kennon PortelaKim Gardner assistant Vitals:   03/08/17 1422  BP: 124/76  Weight: 224 lb (101.6 kg)  Height: 5\' 5"  (1.651 m)   Body mass index is 37.28 kg/m.  General appearance:  Normal affect, orientation and appearance. Skin: Grossly normal HEENT: Without gross lesions.  No cervical or supraclavicular adenopathy. Thyroid normal.  Lungs:  Clear without wheezing, rales or rhonchi Cardiac: RR, without RMG Abdominal:  Soft, nontender, without masses, guarding, rebound, organomegaly or hernia Breasts:  Examined lying and sitting without masses, retractions, discharge or axillary adenopathy. Pelvic:  Ext, BUS, Vagina: Normal  Cervix: Normal  Uterus: Anteverted, normal size, shape and contour, midline and mobile nontender   Adnexa: Without masses or tenderness    Anus and perineum: Normal   Rectovaginal: Normal sphincter tone without palpated masses or tenderness.    Assessment/Plan:  56 y.o. [id number] female for annual exam.   1. Postmenopausal. Patient had single episode of light bleeding last year and was to schedule sonohysterogram but did not follow up for this. She's done no bleeding since then. Recommended observation for now and reporting any bleeding and will proceed with sonohysterogram. If she remains without bleeding them will follow for now as it's been over a year. 2. More prominent fat upper inner left thigh. Present for years unchanged. Exam is normal excepting slightly more fat upper inner left thigh over right thigh but no palpable abnormalities such as lipoma or masses.  As has been present for years patient will monitor and as long as it remains unchanged patient will follow. Will report any changes. 3. Mammography 09/2016. Continue with annual mammography when due. SBE monthly reviewed. Breast exam normal today. 4. Pap smear/HPV 2015. No Pap smear done today. No history of significant abnormal Pap smears. History of ASCUS with negative high-risk HPV 2010. Plan repeat Pap smear at 5 year interval per current screening guidelines. 5. Health maintenance. No routine lab work done as patient does this elsewhere. Follow up 1 year, sooner as needed.   Dara LordsFONTAINE,Ailanie Ruttan P MD, 2:40 PM 03/08/2017

## 2017-04-12 MED FILL — DOXEPIN 10 MG CAPSULE: 10 | 30 days supply | Qty: 60 | Fill #0

## 2017-04-12 MED FILL — ATORVASTATIN 10 MG TABLET: 10 | 90 days supply | Qty: 90 | Fill #0

## 2017-04-12 MED FILL — LISINOPRIL-HCTZ 20-12.5 MG: 20-12.5 | 90 days supply | Qty: 90 | Fill #0

## 2017-05-30 DIAGNOSIS — M1611 Unilateral primary osteoarthritis, right hip: Secondary | ICD-10-CM | POA: Diagnosis not present

## 2017-05-30 DIAGNOSIS — M1712 Unilateral primary osteoarthritis, left knee: Secondary | ICD-10-CM | POA: Diagnosis not present

## 2017-05-30 MED FILL — CELECOXIB 200 MG CAPS: 200 | 30 days supply | Qty: 60 | Fill #0

## 2017-07-25 MED FILL — ATORVASTATIN 10 MG TABLET: 10 | 90 days supply | Qty: 90 | Fill #0

## 2017-07-25 MED FILL — LISINOPRIL-HCTZ 20-12.5 MG: 20-12.5 | 90 days supply | Qty: 90 | Fill #0

## 2017-08-09 DIAGNOSIS — R7309 Other abnormal glucose: Secondary | ICD-10-CM | POA: Diagnosis not present

## 2017-08-09 DIAGNOSIS — M1990 Unspecified osteoarthritis, unspecified site: Secondary | ICD-10-CM | POA: Diagnosis not present

## 2017-08-09 DIAGNOSIS — G479 Sleep disorder, unspecified: Secondary | ICD-10-CM | POA: Diagnosis not present

## 2017-08-09 DIAGNOSIS — E7800 Pure hypercholesterolemia, unspecified: Secondary | ICD-10-CM | POA: Diagnosis not present

## 2017-08-09 DIAGNOSIS — E669 Obesity, unspecified: Secondary | ICD-10-CM | POA: Diagnosis not present

## 2017-08-09 DIAGNOSIS — I10 Essential (primary) hypertension: Secondary | ICD-10-CM | POA: Diagnosis not present

## 2017-08-15 ENCOUNTER — Emergency Department (HOSPITAL_COMMUNITY)
Admission: EM | Admit: 2017-08-15 | Discharge: 2017-08-15 | Disposition: A | Discharge status: Home / Self Care | Payer: No Typology Code available for payment source | Attending: Emergency Medicine | Admitting: Emergency Medicine

## 2017-08-15 ENCOUNTER — Emergency Department (HOSPITAL_COMMUNITY): Payer: No Typology Code available for payment source

## 2017-08-15 ENCOUNTER — Encounter (HOSPITAL_COMMUNITY): Payer: Self-pay | Admitting: *Deleted

## 2017-08-15 ENCOUNTER — Other Ambulatory Visit: Payer: Self-pay

## 2017-08-15 DIAGNOSIS — I10 Essential (primary) hypertension: Secondary | ICD-10-CM | POA: Diagnosis not present

## 2017-08-15 DIAGNOSIS — Y92524 Gas station as the place of occurrence of the external cause: Secondary | ICD-10-CM | POA: Insufficient documentation

## 2017-08-15 DIAGNOSIS — J069 Acute upper respiratory infection, unspecified: Secondary | ICD-10-CM | POA: Diagnosis not present

## 2017-08-15 DIAGNOSIS — Y998 Other external cause status: Secondary | ICD-10-CM | POA: Diagnosis not present

## 2017-08-15 DIAGNOSIS — Z79899 Other long term (current) drug therapy: Secondary | ICD-10-CM | POA: Diagnosis not present

## 2017-08-15 DIAGNOSIS — R05 Cough: Secondary | ICD-10-CM | POA: Diagnosis not present

## 2017-08-15 DIAGNOSIS — Y9389 Activity, other specified: Secondary | ICD-10-CM | POA: Diagnosis not present

## 2017-08-15 DIAGNOSIS — S199XXA Unspecified injury of neck, initial encounter: Secondary | ICD-10-CM | POA: Diagnosis present

## 2017-08-15 DIAGNOSIS — Z7982 Long term (current) use of aspirin: Secondary | ICD-10-CM | POA: Insufficient documentation

## 2017-08-15 DIAGNOSIS — S161XXA Strain of muscle, fascia and tendon at neck level, initial encounter: Secondary | ICD-10-CM | POA: Insufficient documentation

## 2017-08-15 DIAGNOSIS — R0981 Nasal congestion: Secondary | ICD-10-CM | POA: Diagnosis not present

## 2017-08-15 MED ORDER — HYDROCODONE-HOMATROPINE 5-1.5 MG/5ML PO SYRP: 5.0000 mL | ORAL_SOLUTION | Freq: Four times a day (QID) | ORAL | 0 refills | Status: DC | PRN

## 2017-08-15 MED ORDER — ALBUTEROL SULFATE (2.5 MG/3ML) 0.083% IN NEBU
5.0000 mg | INHALATION_SOLUTION | Freq: Once | RESPIRATORY_TRACT | Status: AC
  Administered 2017-08-15: 5 mg via RESPIRATORY_TRACT
  Filled 2017-08-15: qty 6

## 2017-08-15 MED ORDER — CYCLOBENZAPRINE HCL 5 MG PO TABS: 5.0000 mg | ORAL_TABLET | Freq: Two times a day (BID) | ORAL | 0 refills | Status: DC | PRN

## 2017-08-15 MED ORDER — METHOCARBAMOL 500 MG PO TABS
1000.0000 mg | ORAL_TABLET | Freq: Once | ORAL | Status: AC
  Administered 2017-08-15: 1000 mg via ORAL
  Filled 2017-08-15: qty 2

## 2017-08-15 MED ORDER — IBUPROFEN 400 MG PO TABS
600.0000 mg | ORAL_TABLET | Freq: Once | ORAL | Status: AC
  Administered 2017-08-15: 600 mg via ORAL
  Filled 2017-08-15: qty 1
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
## 2017-08-16 MED FILL — CYCLOBENZAPRINE 5 MG TABLET: 5 | 15 days supply | Qty: 30 | Fill #0

## 2017-08-16 MED FILL — HYDROCODONE-HOMATROPINE SYR: 5-1.5 | 3 days supply | Qty: 60 | Fill #0

## 2017-10-18 DIAGNOSIS — Z1231 Encounter for screening mammogram for malignant neoplasm of breast: Secondary | ICD-10-CM | POA: Diagnosis not present

## 2017-10-29 DIAGNOSIS — B349 Viral infection, unspecified: Secondary | ICD-10-CM | POA: Diagnosis not present

## 2017-10-31 MED FILL — PROMETHAZINE-DM SYRUP: 6.25-15 | 6 days supply | Qty: 120 | Fill #0

## 2017-11-01 MED FILL — DOXEPIN 10 MG CAPSULE: 10 | 90 days supply | Qty: 180 | Fill #0

## 2017-11-01 MED FILL — ATORVASTATIN 10 MG TABLET: 10 | 90 days supply | Qty: 90 | Fill #0

## 2017-11-01 MED FILL — LISINOPRIL-HCTZ 20-12.5 MG: 20-12.5 | 90 days supply | Qty: 90 | Fill #0

## 2018-01-26 DIAGNOSIS — J329 Chronic sinusitis, unspecified: Secondary | ICD-10-CM | POA: Diagnosis not present

## 2018-01-26 MED FILL — AMOX-CLAV 875-125 MG TABLET: 875-125 | 10 days supply | Qty: 20 | Fill #0

## 2018-01-26 MED FILL — FLUTICASONE PROP 50 MCG SPR: 50 | 60 days supply | Qty: 16 | Fill #0

## 2018-01-26 MED FILL — PROMETHAZINE W/COD SYRUP: 6.25-10 | 6 days supply | Qty: 120 | Fill #0

## 2018-01-31 MED FILL — ATORVASTATIN 10 MG TABLET: 10 | 90 days supply | Qty: 90 | Fill #1

## 2018-01-31 MED FILL — LISINOPRIL-HCTZ 20-12.5 MG: 20-12.5 | 90 days supply | Qty: 90 | Fill #1

## 2018-03-03 DIAGNOSIS — H5212 Myopia, left eye: Secondary | ICD-10-CM | POA: Diagnosis not present

## 2018-03-03 MED FILL — OLOPATADINE HCL 0.2 % SOLN: 0.2 | 30 days supply | Qty: 3 | Fill #0

## 2018-05-09 MED FILL — ATORVASTATIN 10 MG TABLET: 10 | 90 days supply | Qty: 90 | Fill #2

## 2018-05-09 MED FILL — LISINOPRIL-HCTZ 20-12.5 MG: 20-12.5 | 90 days supply | Qty: 90 | Fill #2

## 2018-08-16 MED FILL — LISINOPRIL-HCTZ 20-12.5 MG: 20-12.5 | 90 days supply | Qty: 90 | Fill #0

## 2018-08-16 MED FILL — ATORVASTATIN 10 MG TABLET: 10 | 90 days supply | Qty: 90 | Fill #0

## 2018-08-22 DIAGNOSIS — G479 Sleep disorder, unspecified: Secondary | ICD-10-CM | POA: Diagnosis not present

## 2018-08-22 DIAGNOSIS — M1990 Unspecified osteoarthritis, unspecified site: Secondary | ICD-10-CM | POA: Diagnosis not present

## 2018-08-22 DIAGNOSIS — R7309 Other abnormal glucose: Secondary | ICD-10-CM | POA: Diagnosis not present

## 2018-08-22 DIAGNOSIS — I10 Essential (primary) hypertension: Secondary | ICD-10-CM | POA: Diagnosis not present

## 2018-08-22 DIAGNOSIS — Z6838 Body mass index (BMI) 38.0-38.9, adult: Secondary | ICD-10-CM | POA: Diagnosis not present

## 2018-08-22 DIAGNOSIS — E7800 Pure hypercholesterolemia, unspecified: Secondary | ICD-10-CM | POA: Diagnosis not present

## 2018-11-08 ENCOUNTER — Other Ambulatory Visit: Payer: Self-pay | Admitting: Family Medicine

## 2018-11-08 ENCOUNTER — Ambulatory Visit: Payer: Self-pay

## 2018-11-08 DIAGNOSIS — M25532 Pain in left wrist: Secondary | ICD-10-CM

## 2018-11-10 ENCOUNTER — Encounter: Payer: Self-pay | Admitting: Gynecology

## 2018-11-10 DIAGNOSIS — Z1231 Encounter for screening mammogram for malignant neoplasm of breast: Secondary | ICD-10-CM | POA: Diagnosis not present

## 2018-11-21 MED FILL — LISINOPRIL-HCTZ 20-12.5 MG: 20-12.5 | 90 days supply | Qty: 90 | Fill #1

## 2018-11-21 MED FILL — ATORVASTATIN 10 MG TABLET: 10 | 90 days supply | Qty: 90 | Fill #1

## 2018-12-11 ENCOUNTER — Other Ambulatory Visit: Payer: Self-pay | Admitting: Family Medicine

## 2018-12-11 ENCOUNTER — Other Ambulatory Visit: Payer: Self-pay

## 2018-12-11 ENCOUNTER — Ambulatory Visit: Payer: Self-pay

## 2018-12-11 DIAGNOSIS — M25561 Pain in right knee: Secondary | ICD-10-CM

## 2018-12-12 DIAGNOSIS — M7672 Peroneal tendinitis, left leg: Secondary | ICD-10-CM | POA: Diagnosis not present

## 2018-12-18 MED FILL — DICLOFENAC SODIUM 75 MG TAB: 75 | 30 days supply | Qty: 60 | Fill #0 | Status: TO

## 2018-12-26 DIAGNOSIS — M7672 Peroneal tendinitis, left leg: Secondary | ICD-10-CM | POA: Diagnosis not present

## 2019-02-27 MED FILL — ATORVASTATIN 10 MG TABLET: 10 | 90 days supply | Qty: 90 | Fill #0

## 2019-02-27 MED FILL — DOXEPIN 10 MG CAPSULE: 10 | 90 days supply | Qty: 180 | Fill #0

## 2019-02-27 MED FILL — LISINOPRIL-HCTZ 20-12.5 MG: 20-12.5 | 90 days supply | Qty: 90 | Fill #0

## 2019-02-27 MED FILL — DICLOFENAC SODIUM 75 MG TAB: 75 | 30 days supply | Qty: 60 | Fill #0

## 2019-05-29 DIAGNOSIS — H40013 Open angle with borderline findings, low risk, bilateral: Secondary | ICD-10-CM | POA: Diagnosis not present

## 2019-05-29 DIAGNOSIS — H524 Presbyopia: Secondary | ICD-10-CM | POA: Diagnosis not present

## 2019-05-29 DIAGNOSIS — H5212 Myopia, left eye: Secondary | ICD-10-CM | POA: Diagnosis not present

## 2019-05-29 DIAGNOSIS — H40053 Ocular hypertension, bilateral: Secondary | ICD-10-CM | POA: Diagnosis not present

## 2019-05-29 DIAGNOSIS — H52223 Regular astigmatism, bilateral: Secondary | ICD-10-CM | POA: Diagnosis not present

## 2019-06-04 MED FILL — LISINOPRIL-HCTZ 20-12.5 MG: 20-12.5 | 90 days supply | Qty: 90 | Fill #1

## 2019-06-04 MED FILL — ATORVASTATIN 10 MG TABLET: 10 | 90 days supply | Qty: 90 | Fill #1

## 2019-06-19 ENCOUNTER — Encounter: Payer: Self-pay | Admitting: Gynecology

## 2019-06-27 DIAGNOSIS — Z1159 Encounter for screening for other viral diseases: Secondary | ICD-10-CM | POA: Diagnosis not present

## 2019-06-28 MED FILL — PEG-3350 SOLUTION: 420 | 1 days supply | Qty: 4000 | Fill #0

## 2019-07-02 DIAGNOSIS — K640 First degree hemorrhoids: Secondary | ICD-10-CM | POA: Diagnosis not present

## 2019-07-02 DIAGNOSIS — Z86010 Personal history of colonic polyps: Secondary | ICD-10-CM | POA: Diagnosis not present

## 2019-07-02 DIAGNOSIS — K635 Polyp of colon: Secondary | ICD-10-CM | POA: Diagnosis not present

## 2019-07-02 DIAGNOSIS — K5730 Diverticulosis of large intestine without perforation or abscess without bleeding: Secondary | ICD-10-CM | POA: Diagnosis not present

## 2019-07-26 DIAGNOSIS — M25571 Pain in right ankle and joints of right foot: Secondary | ICD-10-CM | POA: Diagnosis not present

## 2019-07-26 DIAGNOSIS — M25512 Pain in left shoulder: Secondary | ICD-10-CM | POA: Diagnosis not present

## 2019-07-26 MED FILL — DICLOFENAC SODIUM 75 MG TAB: 75 | 30 days supply | Qty: 60 | Fill #1

## 2019-07-26 MED FILL — predniSONE 10 MG TABS: 10 | 12 days supply | Qty: 48 | Fill #0

## 2019-08-27 MED FILL — LISINOPRIL-HCTZ 20-12.5 MG: 20-12.5 | 90 days supply | Qty: 90 | Fill #2

## 2019-08-27 MED FILL — ATORVASTATIN 10 MG TABLET: 10 | 90 days supply | Qty: 90 | Fill #2

## 2019-11-01 ENCOUNTER — Other Ambulatory Visit: Payer: Self-pay

## 2019-11-02 ENCOUNTER — Encounter: Payer: Self-pay | Admitting: Obstetrics and Gynecology

## 2019-11-02 ENCOUNTER — Other Ambulatory Visit: Payer: Self-pay

## 2019-11-02 ENCOUNTER — Ambulatory Visit (INDEPENDENT_AMBULATORY_CARE_PROVIDER_SITE_OTHER): Payer: 59 | Admitting: Obstetrics and Gynecology

## 2019-11-02 VITALS — BP 130/80 | Ht 65.0 in | Wt 229.0 lb

## 2019-11-02 DIAGNOSIS — Z01419 Encounter for gynecological examination (general) (routine) without abnormal findings: Secondary | ICD-10-CM

## 2019-11-02 DIAGNOSIS — Z124 Encounter for screening for malignant neoplasm of cervix: Secondary | ICD-10-CM

## 2019-11-02 DIAGNOSIS — Z79890 Hormone replacement therapy (postmenopausal): Secondary | ICD-10-CM | POA: Diagnosis not present

## 2019-11-02 MED ORDER — COMBIPATCH 0.05-0.14 MG/DAY TD PTTW: 1.0000 | MEDICATED_PATCH | TRANSDERMAL | 12 refills | Status: DC

## 2019-11-02 MED FILL — COMBIPATCH 0.05-0.14 MG PTC: 0.05-0.14 | 28 days supply | Qty: 8 | Fill #0

## 2019-11-02 NOTE — Patient Instructions (Addendum)
Try the Combipatch estrogen-progestin patch twice per week per the prescription instructions. Please remember to schedule your annual mammogram this year.   Menopause and Hormone Replacement Therapy Menopause is a normal time of life when menstrual periods stop completely and the ovaries stop producing the female hormones estrogen and progesterone. This lack of hormones can affect your health and cause undesirable symptoms. Hormone replacement therapy (HRT) can relieve some of those symptoms. What is hormone replacement therapy? HRT is the use of artificial (synthetic) hormones to replace hormones that your body has stopped producing because you have reached menopause. What are my options for HRT?  HRT may consist of the synthetic hormones estrogen and progestin, or it may consist of only estrogen (estrogen-only therapy). You and your health care provider will decide which form of HRT is best for you. If you choose to be on HRT and you have a uterus, estrogen and progestin are usually prescribed. Estrogen-only therapy is used for women who do not have a uterus. Possible options for taking HRT include:  Pills.  Patches.  Gels.  Sprays.  Vaginal cream.  Vaginal rings.  Vaginal inserts. The amount of hormone(s) that you take and how long you take the hormone(s) varies according to your health. It is important to:  Begin HRT with the lowest possible dosage.  Stop HRT as soon as your health care provider tells you to stop.  Work with your health care provider so that you feel informed and comfortable with your decisions. What are the benefits of HRT? HRT can reduce the frequency and severity of menopausal symptoms. Benefits of HRT vary according to the kind of symptoms that you have, how severe they are, and your overall health. HRT may help to improve the following symptoms of menopause:  Hot flashes and night sweats. These are sudden feelings of heat that spread over the face and  body. The skin may turn red, like a blush. Night sweats are hot flashes that happen while you are sleeping or trying to sleep.  Bone loss (osteoporosis). The body loses calcium more quickly after menopause, causing the bones to become weaker. This can increase the risk for bone breaks (fractures).  Vaginal dryness. The lining of the vagina can become thin and dry, which can cause pain during sex or cause infection, burning, or itching.  Urinary tract infections.  Urinary incontinence. This is the inability to control when you pass urine.  Irritability.  Short-term memory problems. What are the risks of HRT? Risks of HRT vary depending on your individual health and medical history. Risks of HRT also depend on whether you receive both estrogen and progestin or you receive estrogen only. HRT may increase the risk of:  Spotting. This is when a small amount of blood leaks from the vagina unexpectedly.  Endometrial cancer. This cancer is in the lining of the uterus (endometrium).  Breast cancer.  Increased density of breast tissue. This can make it harder to find breast cancer on a breast X-ray (mammogram).  Stroke.  Heart disease.  Blood clots.  Gallbladder disease.  Liver disease. Risks of HRT can increase if you have any of the following conditions:  Endometrial cancer.  Liver disease.  Heart disease.  Breast cancer.  History of blood clots.  History of stroke. Follow these instructions at home:  Take over-the-counter and prescription medicines only as told by your health care provider.  Get mammograms, pelvic exams, and medical checkups as often as told by your health care provider.  Have Pap tests done as often as told by your health care provider. A Pap test is sometimes called a Pap smear. It is a screening test that is used to check for signs of cancer of the cervix and vagina. A Pap test can also identify the presence of infection or precancerous changes. Pap  tests may be done: ? Every 3 years, starting at age 12. ? Every 5 years, starting after age 89, in combination with testing for human papillomavirus (HPV). ? More often or less often depending on other medical conditions you have, your age, and other risk factors.  It is up to you to get the results of your Pap test. Ask your health care provider, or the department that is doing the test, when your results will be ready.  Keep all follow-up visits as told by your health care provider. This is important. Contact a health care provider if you have:  Pain or swelling in your legs.  Shortness of breath.  Chest pain.  Lumps or changes in your breasts or armpits.  Slurred speech.  Pain, burning, or bleeding when you urinate.  Unusual vaginal bleeding.  Dizziness or headaches.  Weakness or numbness in any part of your arms or legs.  Pain in your abdomen. Summary  Menopause is a normal time of life when menstrual periods stop completely and the ovaries stop producing the female hormones estrogen and progesterone.  Hormone replacement therapy (HRT) can relieve some of the symptoms of menopause.  HRT can reduce the frequency and severity of menopausal symptoms.  Risks of HRT vary depending on your individual health and medical history. This information is not intended to replace advice given to you by your health care provider. Make sure you discuss any questions you have with your health care provider. Document Revised: 05/09/2018 Document Reviewed: 05/09/2018 Elsevier Patient Education  2020 [employer].

## 2019-11-02 NOTE — Progress Notes (Signed)
JEZEBEL POLLET 1961/03/01 [id number]  SUBJECTIVE:  59 y.o. [id number] female for annual routine gynecologic exam and Pap smear. She has been having increasing hot flashes and would like to seek therapy for this.  She says atrial fibrillation is no longer a problem for her and it resolved.  Current Outpatient Medications  Medication Sig Dispense Refill  . aspirin EC 81 MG tablet Take 81 mg by mouth daily.    . ATORVASTATIN CALCIUM PO Take by mouth.    Marland Kitchen lisinopril (PRINIVIL,ZESTRIL) 10 MG tablet Take 10 mg by mouth daily.     No current facility-administered medications for this visit.   Allergies: Patient has no known allergies.  Patient's last menstrual period was 10/02/2013.  Past medical history,surgical history, problem list, medications, allergies, family history and social history were all reviewed and documented as reviewed in the EPIC chart.  ROS:  Feeling well. No dyspnea or chest pain on exertion.  No abdominal pain, change in bowel habits, black or bloody stools.  No urinary tract symptoms. GYN ROS: no abnormal bleeding, pelvic pain or discharge, no breast pain or new or enlarging lumps on self exam. No neurological complaints.  OBJECTIVE:  BP 130/80 (BP Location: Right Arm, Patient Position: Sitting, Cuff Size: Large)   Ht 5\' 5"  (1.651 m)   Wt 229 lb (103.9 kg)   LMP 10/02/2013   BMI 38.11 kg/m  The patient appears well, alert, oriented x 3, in no distress. ENT normal.  Neck supple. No cervical or supraclavicular adenopathy or thyromegaly.  Lungs are clear, good air entry, no wheezes, rhonchi or rales. S1 and S2 normal, no murmurs, regular rate and rhythm.  Abdomen soft without tenderness, guarding, mass or organomegaly.  Neurological is normal, no focal findings.  BREAST EXAM: breasts appear normal, no suspicious masses, no skin or nipple changes or axillary nodes  PELVIC EXAM: VULVA: normal appearing vulva with no masses, tenderness or lesions, VAGINA: normal  appearing vagina with normal color and discharge, no lesions, CERVIX: normal appearing cervix without discharge or lesions, UTERUS: uterus is normal size, shape, consistency and nontender, ADNEXA: normal adnexa in size, nontender and no masses, RECTAL: normal rectal, no masses, PAP: Pap smear done today, thin-prep method  Chaperone:  Kim B present during the examination  ASSESSMENT:  59 y.o. [id number] here for annual gynecologic exam  PLAN:   1.  Postmenopausal/HRT discussion.  The patient is having increasing hot flashes and wants to seek therapy for this.  We discussed the need for progestin as she still has a uterus.  We discussed estrogen therapy.  Patch formulation will be the safest to avoid weight gain and theoretically might have some benefits over oral formulation.  Still, the risks are present with using HRT, including potential increased risks of uterine or breast cancer, and thrombotic diseases including heart attack, stroke, DVT, PE.  The patient has a normal and regular heart rate rhythm today and says the atrial fibrillation has resolved no longer is a problem as this would increase her risk for blood clot formation.  Decided on CombiPatch at the lower dose and she will try this and if she is not having any benefit or experiencing side effects she will let us know. 2. Pap smear 11/2013. Pap smear is repeated today. No signficant prior history of abnormal Pap smears.  3. Mammogram 10/2018. Will continue with annual mammography. Breast exam normal today. 4. Colonoscopy 2015. Recommended that she continue per the prescribed interval.   5. DEXA indicated further  into menopause. 6. Health maintenance.  No lab work as she has this completed with her primary care provider.   Return annually or sooner, prn.  Theresia Majors MD  11/02/19

## 2019-11-06 LAB — PAP, TP IMAGING W/ HPV RNA, RFLX HPV TYPE 16,18/45: HPV DNA High Risk: NOT DETECTED

## 2019-11-09 ENCOUNTER — Telehealth: Payer: Self-pay | Admitting: *Deleted

## 2019-11-09 NOTE — Telephone Encounter (Signed)
Patient said she is willing to try this.

## 2019-11-09 NOTE — Telephone Encounter (Signed)
I am not aware of any other combination patch, she could try an estrogen patch and take oral prometrium at least 14 days each month. Is that what she would like?

## 2019-11-09 NOTE — Telephone Encounter (Signed)
Patient was prescribed Combipatch 0.05-0.14 on 11/02/19 OV, patient said patch is too expensive for her, it does not come in a generic, only brand. She asked if another patch could be prescribed, I did explained she may not be able to get a combination patch as this may be why the price in expensive, but I will check with you. Please advise

## 2019-11-15 ENCOUNTER — Other Ambulatory Visit: Payer: Self-pay | Admitting: Obstetrics and Gynecology

## 2019-11-15 MED ORDER — PROGESTERONE MICRONIZED 100 MG PO CAPS: 100.0000 mg | ORAL_CAPSULE | Freq: Every day | ORAL | 11 refills | Status: DC

## 2019-11-15 MED ORDER — ESTRADIOL 0.05 MG/24HR TD PTTW: 1.0000 | MEDICATED_PATCH | TRANSDERMAL | 12 refills | Status: DC

## 2019-11-15 MED FILL — PROGESTERONE 100 MG CAPSULE: 100 | 30 days supply | Qty: 30 | Fill #0

## 2019-11-15 MED FILL — DOTTI 0.05 MG/24HR PTTW: 0.05 | 28 days supply | Qty: 8 | Fill #0

## 2019-11-15 NOTE — Telephone Encounter (Signed)
Patient informed. 

## 2019-11-15 NOTE — Telephone Encounter (Signed)
I have prescribed a low dose estrogen patch twice weekly, and the nightly prometrium 100 mg PO, which is a lower dose but she should take it every night.

## 2019-11-27 MED FILL — ATORVASTATIN 10 MG TABLET: 10 | 30 days supply | Qty: 30 | Fill #0

## 2019-11-27 MED FILL — LISINOPRIL-HCTZ 20-12.5 MG: 20-12.5 | 30 days supply | Qty: 30 | Fill #0

## 2019-11-29 MED FILL — DICLOFENAC SOD EC 75 MG TAB: 75 | 30 days supply | Qty: 60 | Fill #0

## 2019-12-11 ENCOUNTER — Encounter: Payer: Self-pay | Admitting: Obstetrics and Gynecology

## 2019-12-11 DIAGNOSIS — Z1231 Encounter for screening mammogram for malignant neoplasm of breast: Secondary | ICD-10-CM | POA: Diagnosis not present

## 2019-12-11 MED FILL — DOTTI 0.05 MG/24HR PTTW: 0.05 | 28 days supply | Qty: 8 | Fill #1

## 2019-12-12 ENCOUNTER — Ambulatory Visit: Payer: 59 | Attending: Internal Medicine

## 2019-12-12 MED FILL — PROGESTERONE 100 MG CAPSULE: 100 | 30 days supply | Qty: 30 | Fill #1

## 2019-12-20 ENCOUNTER — Telehealth: Payer: Self-pay | Admitting: *Deleted

## 2019-12-20 NOTE — Telephone Encounter (Signed)
Patient informed. 

## 2019-12-20 NOTE — Telephone Encounter (Signed)
Patient takes vivelle dot patch 0.05 mg patch and progesterone 100 mg, started HRT in Feb this year. reports she has noticed 2 days of spotting when wiping and on thin liner. Patient was taking progesterone in the morning and recently switched to taking in the evening. I did tell her recommendation is to take at bedtime. Patent asked if you think she should monitor for now and follow up if bleeding should increase? Please advise

## 2019-12-20 NOTE — Telephone Encounter (Signed)
Yes, take progesterone in the evening.  Ok to just monitor for now.  If any further spotting beyond the next month at all she should let us know.

## 2020-01-01 MED FILL — LISINOPRIL-HCTZ 20-12.5 MG: 20-12.5 | 30 days supply | Qty: 30 | Fill #0

## 2020-01-04 ENCOUNTER — Other Ambulatory Visit (HOSPITAL_COMMUNITY): Payer: Self-pay | Admitting: Family Medicine

## 2020-01-04 DIAGNOSIS — R7309 Other abnormal glucose: Secondary | ICD-10-CM | POA: Diagnosis not present

## 2020-01-04 DIAGNOSIS — M1990 Unspecified osteoarthritis, unspecified site: Secondary | ICD-10-CM | POA: Diagnosis not present

## 2020-01-04 DIAGNOSIS — I10 Essential (primary) hypertension: Secondary | ICD-10-CM | POA: Diagnosis not present

## 2020-01-04 DIAGNOSIS — E7800 Pure hypercholesterolemia, unspecified: Secondary | ICD-10-CM | POA: Diagnosis not present

## 2020-01-04 DIAGNOSIS — G479 Sleep disorder, unspecified: Secondary | ICD-10-CM | POA: Diagnosis not present

## 2020-01-04 MED FILL — ATORVASTATIN 10 MG TABLET: 10 | 90 days supply | Qty: 90 | Fill #0

## 2020-01-04 MED FILL — DOXEPIN 10 MG CAPSULE: 10 | 90 days supply | Qty: 180 | Fill #0

## 2020-01-07 MED FILL — DOTTI 0.05 MG/24HR PTTW: 0.05 | 28 days supply | Qty: 8 | Fill #2

## 2020-01-17 MED FILL — PROGESTERONE 100 MG CAPSULE: 100 | 30 days supply | Qty: 30 | Fill #2

## 2020-01-21 ENCOUNTER — Telehealth: Payer: Self-pay | Admitting: *Deleted

## 2020-01-21 NOTE — Telephone Encounter (Signed)
Patient called to follow up from telephone encounter on 12/20/19. Reports spotting did stop at some point,but now she is having a cycle, using vivelle dot patch and progesterone 100 mg at bedtime. Told to call and inform you if this should continue, reports the bleeding is off/on. Please advise

## 2020-01-22 NOTE — Telephone Encounter (Signed)
I would recommend wean over 1 week by doing 1 estrogen patch per week and the progesterone tablet every other night.  The next week she can just stop taking both.  I would endorse stopping HRT at this time.

## 2020-01-22 NOTE — Telephone Encounter (Signed)
Patient called back stating bleeding has increased to changing pads every 2 hours. She would like to stop HRT due to bleeding,asked if she needs to wean off medications? Or okay to just stop?

## 2020-01-23 NOTE — Telephone Encounter (Signed)
Patient informed with below.  

## 2020-03-20 ENCOUNTER — Telehealth: Payer: Self-pay

## 2020-03-20 NOTE — Telephone Encounter (Signed)
I called patient to clarify.  She was actually using the Vivelle Dot (she calls it "Dotti patch")  And the Prometrium.  This morning she had referred to it as Combipatch but that was RX previous to this one I believe.  She said she is still having hot flashes and would like something for them.

## 2020-03-20 NOTE — Telephone Encounter (Signed)
Potential issues here are inconsistent use of the estrogen and progesterone can lead to spotting.  I thought she went on a regular estrogen patch (due to coverage issues for the combipatch)?  And then she was taking Prometrium as well?  If having any bleeding especially if she was taking the combipatch it is probably best to stay off of the hormones if that is doable for her.

## 2020-03-20 NOTE — Telephone Encounter (Signed)
Patient called back on 12/20/19 with spotting with Combipatch.  She said the following week she stopped taking them and has had no further spotting/bleeding.  She called today to see if you would prescribe oral Estradiol tablet for her. Her sister takes them and she wants to try to.

## 2020-03-21 ENCOUNTER — Other Ambulatory Visit: Payer: Self-pay

## 2020-03-21 MED ORDER — PROGESTERONE MICRONIZED 100 MG PO CAPS: ORAL_CAPSULE | ORAL | 2 refills | Status: DC

## 2020-03-21 MED ORDER — ESTRADIOL 0.5 MG PO TABS: 0.5000 mg | ORAL_TABLET | Freq: Every day | ORAL | 2 refills | Status: DC

## 2020-03-21 MED FILL — PROGESTERONE 100 MG CAPSULE: 100 | 90 days supply | Qty: 90 | Fill #0

## 2020-03-21 MED FILL — ESTRADIOL 0.5 MG TABS: 0.5 | 90 days supply | Qty: 90 | Fill #0

## 2020-03-21 NOTE — Progress Notes (Signed)
I spoke with patient and informed her by reading her Dr. Markus Jarvis message. I, also, advised her of need to take Prometrium nightly as it prescribed with estrogen to protect her uterine lining. She voiced understanding.  She will call for an office visit if bleeding continues past the next few weeks.

## 2020-03-21 NOTE — Telephone Encounter (Signed)
Please start her on 0.5 mg estradiol daily, warn of possible increased blood clot risk vs patch, but lower dose is less risky Also keep taking the prometrium daily. If bleeding continues beyond the next few weeks she needs to be seen, Thank you

## 2020-04-11 MED FILL — ATORVASTATIN CALCIUM 10 MG: 10 | 90 days supply | Qty: 90 | Fill #1

## 2020-04-30 MED FILL — LISINOPRIL-HCTZ 20-12.5 MG: 20-12.5 | 90 days supply | Qty: 90 | Fill #1

## 2020-05-23 DIAGNOSIS — H40013 Open angle with borderline findings, low risk, bilateral: Secondary | ICD-10-CM | POA: Diagnosis not present

## 2020-06-19 MED FILL — ESTRADIOL 0.5 MG TABS: 0.5 | 90 days supply | Qty: 90 | Fill #1

## 2020-07-10 MED FILL — ATORVASTATIN CALCIUM 10 MG: 10 | 90 days supply | Qty: 90 | Fill #2

## 2020-07-11 MED FILL — LISINOPRIL-HCTZ 20-12.5 MG: 20-12.5 | 90 days supply | Qty: 90 | Fill #2

## 2020-07-17 MED FILL — PROGESTERONE 100 MG CAPSULE: 100 | 90 days supply | Qty: 90 | Fill #1

## 2020-09-15 MED FILL — DOXEPIN 10 MG CAPSULE: 10 | 90 days supply | Qty: 180 | Fill #0

## 2020-09-17 MED FILL — ESTRADIOL 0.5 MG TABS: 0.5 | 90 days supply | Qty: 90 | Fill #2

## 2020-09-22 MED FILL — ATORVASTATIN CALCIUM 10 MG: 10 | 90 days supply | Qty: 90 | Fill #3

## 2020-09-30 MED FILL — LISINOPRIL-HCTZ 20-12.5 MG: 20-12.5 | 90 days supply | Qty: 90 | Fill #3

## 2020-10-17 MED FILL — PROGESTERONE 100 MG CAPSULE: 100 | 90 days supply | Qty: 90 | Fill #2

## 2020-10-23 ENCOUNTER — Other Ambulatory Visit (HOSPITAL_COMMUNITY): Payer: Self-pay | Admitting: Physician Assistant

## 2020-10-23 DIAGNOSIS — M25551 Pain in right hip: Secondary | ICD-10-CM | POA: Diagnosis not present

## 2020-10-23 MED FILL — DICLOFENAC SOD EC 75 MG TAB: 75 | 30 days supply | Qty: 60 | Fill #0

## 2020-12-16 ENCOUNTER — Other Ambulatory Visit (HOSPITAL_COMMUNITY): Payer: Self-pay | Admitting: Family Medicine

## 2020-12-17 ENCOUNTER — Telehealth: Payer: Self-pay | Admitting: *Deleted

## 2020-12-17 ENCOUNTER — Other Ambulatory Visit: Payer: Self-pay | Admitting: Obstetrics & Gynecology

## 2020-12-17 MED ORDER — PROGESTERONE MICRONIZED 100 MG PO CAPS: ORAL_CAPSULE | ORAL | 0 refills | Status: DC

## 2020-12-17 MED ORDER — ESTRADIOL 0.5 MG PO TABS: 0.5000 mg | ORAL_TABLET | Freq: Every day | ORAL | 0 refills | Status: DC

## 2020-12-17 MED FILL — ESTRADIOL 0.5 MG TABS: 0.5 | 90 days supply | Qty: 90 | Fill #0

## 2020-12-17 NOTE — Telephone Encounter (Signed)
Patient has annual exam scheduled on 02/27/21 needs refill on estradiol 0.5 mg tablet and progesterone 100 mg capsule. Rx sent.

## 2021-01-01 ENCOUNTER — Other Ambulatory Visit (HOSPITAL_COMMUNITY): Payer: Self-pay

## 2021-01-01 MED ORDER — LISINOPRIL-HYDROCHLOROTHIAZIDE 20-12.5 MG PO TABS
1.0000 | ORAL_TABLET | Freq: Every day | ORAL | 0 refills | Status: DC
  Filled 2021-01-01: qty 90, 90d supply, fill #0

## 2021-01-01 MED ORDER — ATORVASTATIN CALCIUM 10 MG PO TABS
10.0000 mg | ORAL_TABLET | Freq: Every day | ORAL | 0 refills | Status: DC
  Filled 2021-01-01: qty 90, 90d supply, fill #0

## 2021-01-02 ENCOUNTER — Other Ambulatory Visit (HOSPITAL_COMMUNITY): Payer: Self-pay

## 2021-01-02 DIAGNOSIS — Z1231 Encounter for screening mammogram for malignant neoplasm of breast: Secondary | ICD-10-CM | POA: Diagnosis not present

## 2021-01-15 ENCOUNTER — Other Ambulatory Visit (HOSPITAL_COMMUNITY): Payer: Self-pay

## 2021-01-15 MED FILL — Progesterone Cap 100 MG: ORAL | 90 days supply | Qty: 90 | Fill #0 | Status: AC

## 2021-02-27 ENCOUNTER — Other Ambulatory Visit (HOSPITAL_COMMUNITY): Payer: Self-pay

## 2021-02-27 ENCOUNTER — Ambulatory Visit (INDEPENDENT_AMBULATORY_CARE_PROVIDER_SITE_OTHER): Payer: 59 | Admitting: Obstetrics & Gynecology

## 2021-02-27 ENCOUNTER — Encounter: Payer: Self-pay | Admitting: Obstetrics & Gynecology

## 2021-02-27 ENCOUNTER — Other Ambulatory Visit: Payer: Self-pay

## 2021-02-27 VITALS — BP 124/80 | Ht 65.0 in | Wt 228.0 lb

## 2021-02-27 DIAGNOSIS — Z01419 Encounter for gynecological examination (general) (routine) without abnormal findings: Secondary | ICD-10-CM

## 2021-02-27 DIAGNOSIS — Z79890 Hormone replacement therapy (postmenopausal): Secondary | ICD-10-CM

## 2021-02-27 DIAGNOSIS — Z6837 Body mass index (BMI) 37.0-37.9, adult: Secondary | ICD-10-CM | POA: Diagnosis not present

## 2021-02-27 DIAGNOSIS — E6609 Other obesity due to excess calories: Secondary | ICD-10-CM | POA: Diagnosis not present

## 2021-02-27 MED ORDER — PROGESTERONE MICRONIZED 100 MG PO CAPS
100.0000 mg | ORAL_CAPSULE | Freq: Every day | ORAL | 4 refills | Status: DC
  Filled 2021-02-27 – 2021-04-13 (×2): qty 90, 90d supply, fill #0
  Filled 2021-07-16: qty 90, 90d supply, fill #1
  Filled 2021-10-20: qty 90, 90d supply, fill #2
  Filled 2022-01-18: qty 90, 90d supply, fill #3

## 2021-02-27 MED ORDER — ESTRADIOL 0.5 MG PO TABS
0.5000 mg | ORAL_TABLET | Freq: Every day | ORAL | 4 refills | Status: DC
  Filled 2021-02-27 – 2021-03-18 (×2): qty 90, 90d supply, fill #0
  Filled 2021-06-23: qty 90, 90d supply, fill #1
  Filled 2021-09-23: qty 90, 90d supply, fill #2
  Filled 2022-01-11: qty 90, 90d supply, fill #3

## 2021-02-27 NOTE — Progress Notes (Signed)
Elizabeth [name] [birth year] [id number]   History:    60 y.o. [id number] Remarried x >20 yrs.  3 grand-children.  RP:  Established patient presenting for annual gyn exam   HPI: Postmenopause, well on HRT with Estradiol 0.5 mg tab PO daily and Prometrium 100 mg PO HS.  No PMB.  No pelvic pain.  Abstinent.  Breasts normal.  Urine/BMs normal.  BMI 37.94.  [place] 2019.  Health labs with Fam MD.  Past medical history,surgical history, family history and social history were all reviewed and documented in the EPIC chart.  Gynecologic History Patient's last menstrual period was 10/02/2013.  Obstetric History OB History  Gravida Para Term Preterm AB Living  4 3 3   1 3   SAB IAB Ectopic Multiple Live Births  1            # Outcome Date GA Lbr Len/2nd Weight Sex Delivery Anes PTL Lv  4 SAB           3 Term           2 Term           1 Term              ROS: A ROS was performed and pertinent positives and negatives are included in the history.  GENERAL: No fevers or chills. HEENT: No change in vision, no earache, sore throat or sinus congestion. NECK: No pain or stiffness. CARDIOVASCULAR: No chest pain or pressure. No palpitations. PULMONARY: No shortness of breath, cough or wheeze. GASTROINTESTINAL: No abdominal pain, nausea, vomiting or diarrhea, melena or bright red blood per rectum. GENITOURINARY: No urinary frequency, urgency, hesitancy or dysuria. MUSCULOSKELETAL: No joint or muscle pain, no back pain, no recent trauma. DERMATOLOGIC: No rash, no itching, no lesions. ENDOCRINE: No polyuria, polydipsia, no heat or cold intolerance. No recent change in weight. HEMATOLOGICAL: No anemia or easy bruising or bleeding. NEUROLOGIC: No headache, seizures, numbness, tingling or weakness. PSYCHIATRIC: No depression, no loss of interest in normal activity or change in sleep pattern.     Exam:   BP 124/80   Ht 5\' 5"  (1.651 m)   Wt 228 lb (103.4 kg)   LMP 10/02/2013   BMI 37.94 kg/m   Body mass  index is 37.94 kg/m.  General appearance : Well developed well nourished female. No acute distress HEENT: Eyes: no retinal hemorrhage or exudates,  Neck supple, trachea midline, no carotid bruits, no thyroidmegaly Lungs: Clear to auscultation, no rhonchi or wheezes, or rib retractions  Heart: Regular rate and rhythm, no murmurs or gallops Breast:Examined in sitting and supine position were symmetrical in appearance, no palpable masses or tenderness,  no skin retraction, no nipple inversion, no nipple discharge, no skin discoloration, no axillary or supraclavicular lymphadenopathy Abdomen: no palpable masses or tenderness, no rebound or guarding Extremities: no edema or skin discoloration or tenderness  Pelvic: Vulva: Normal             Vagina: No gross lesions or discharge  Cervix: No gross lesions or discharge  Uterus  AV, normal size, shape and consistency, non-tender and mobile  Adnexa  Without masses or tenderness  Anus: Normal   Assessment/Plan:  60 y.o. female for annual exam   1. Well female exam with routine gynecological exam Normal gynecologic exam in menopause.  Pap test 10/2019 was negative, will repeat a Pap test at 3 years.  Breast exam normal.  Screening mammogram March 2022 was negative.  Colonoscopy in 2019.  Health labs with family physician.  2. Postmenopausal hormone replacement therapy Well on hormone replacement therapy with estradiol 0.5 mg tablet per mouth daily and Prometrium 100 mg capsule at bedtime.  Usage, risks and benefits reviewed.  Prescription sent to pharmacy.  3. Class 2 obesity due to excess calories without serious comorbidity with body mass index (BMI) of 37.0 to 37.9 in adult  Recommend a lower calorie/carb diet.  Aerobic activities 5 times a week and light weightlifting every 2 days.  Genia Del MD, 1:45 PM 02/27/2021

## 2021-03-02 ENCOUNTER — Other Ambulatory Visit (HOSPITAL_COMMUNITY): Payer: Self-pay

## 2021-03-03 ENCOUNTER — Other Ambulatory Visit (HOSPITAL_COMMUNITY): Payer: Self-pay

## 2021-03-03 DIAGNOSIS — I10 Essential (primary) hypertension: Secondary | ICD-10-CM | POA: Diagnosis not present

## 2021-03-03 DIAGNOSIS — G479 Sleep disorder, unspecified: Secondary | ICD-10-CM | POA: Diagnosis not present

## 2021-03-03 DIAGNOSIS — M1990 Unspecified osteoarthritis, unspecified site: Secondary | ICD-10-CM | POA: Diagnosis not present

## 2021-03-03 DIAGNOSIS — R7309 Other abnormal glucose: Secondary | ICD-10-CM | POA: Diagnosis not present

## 2021-03-03 DIAGNOSIS — E7800 Pure hypercholesterolemia, unspecified: Secondary | ICD-10-CM | POA: Diagnosis not present

## 2021-03-03 MED ORDER — DOXEPIN HCL 10 MG PO CAPS
20.0000 mg | ORAL_CAPSULE | Freq: Every day | ORAL | 3 refills | Status: DC | PRN
  Filled 2021-03-03 – 2021-06-23 (×2): qty 180, 90d supply, fill #0
  Filled 2022-02-10: qty 180, 90d supply, fill #1

## 2021-03-03 MED ORDER — LISINOPRIL-HYDROCHLOROTHIAZIDE 20-12.5 MG PO TABS
1.0000 | ORAL_TABLET | Freq: Every day | ORAL | 3 refills | Status: DC
  Filled 2021-03-03 – 2021-06-23 (×2): qty 90, 90d supply, fill #0
  Filled 2021-09-23: qty 90, 90d supply, fill #1
  Filled 2021-12-29: qty 90, 90d supply, fill #2

## 2021-03-03 MED ORDER — ATORVASTATIN CALCIUM 10 MG PO TABS
10.0000 mg | ORAL_TABLET | Freq: Every day | ORAL | 3 refills | Status: DC
  Filled 2021-03-03 – 2021-04-13 (×2): qty 90, 90d supply, fill #0
  Filled 2021-07-16: qty 90, 90d supply, fill #1
  Filled 2021-10-20: qty 90, 90d supply, fill #2
  Filled 2021-12-29: qty 90, 90d supply, fill #3

## 2021-03-11 ENCOUNTER — Other Ambulatory Visit (HOSPITAL_COMMUNITY): Payer: Self-pay

## 2021-03-12 ENCOUNTER — Other Ambulatory Visit (HOSPITAL_COMMUNITY): Payer: Self-pay

## 2021-03-18 ENCOUNTER — Other Ambulatory Visit (HOSPITAL_COMMUNITY): Payer: Self-pay

## 2021-03-26 ENCOUNTER — Other Ambulatory Visit (HOSPITAL_COMMUNITY): Payer: Self-pay

## 2021-03-26 MED FILL — Lisinopril & Hydrochlorothiazide Tab 20-12.5 MG: ORAL | 90 days supply | Qty: 90 | Fill #0 | Status: AC

## 2021-03-30 ENCOUNTER — Other Ambulatory Visit (HOSPITAL_COMMUNITY): Payer: Self-pay

## 2021-04-13 ENCOUNTER — Other Ambulatory Visit (HOSPITAL_COMMUNITY): Payer: Self-pay

## 2021-06-09 DIAGNOSIS — H5212 Myopia, left eye: Secondary | ICD-10-CM | POA: Diagnosis not present

## 2021-06-23 ENCOUNTER — Other Ambulatory Visit (HOSPITAL_COMMUNITY): Payer: Self-pay

## 2021-07-02 ENCOUNTER — Other Ambulatory Visit: Payer: Self-pay

## 2021-07-02 ENCOUNTER — Observation Stay (HOSPITAL_COMMUNITY)
Admission: EM | Admit: 2021-07-02 | Discharge: 2021-07-03 | Disposition: A | Discharge status: Home / Self Care | Payer: 59 | Attending: Internal Medicine | Admitting: Internal Medicine

## 2021-07-02 ENCOUNTER — Emergency Department (HOSPITAL_COMMUNITY): Payer: 59

## 2021-07-02 DIAGNOSIS — I4891 Unspecified atrial fibrillation: Secondary | ICD-10-CM | POA: Insufficient documentation

## 2021-07-02 DIAGNOSIS — Z20822 Contact with and (suspected) exposure to covid-19: Secondary | ICD-10-CM | POA: Insufficient documentation

## 2021-07-02 DIAGNOSIS — Z79899 Other long term (current) drug therapy: Secondary | ICD-10-CM | POA: Insufficient documentation

## 2021-07-02 DIAGNOSIS — Z7982 Long term (current) use of aspirin: Secondary | ICD-10-CM | POA: Diagnosis not present

## 2021-07-02 DIAGNOSIS — R29818 Other symptoms and signs involving the nervous system: Secondary | ICD-10-CM | POA: Diagnosis not present

## 2021-07-02 DIAGNOSIS — R001 Bradycardia, unspecified: Secondary | ICD-10-CM | POA: Diagnosis not present

## 2021-07-02 DIAGNOSIS — R42 Dizziness and giddiness: Secondary | ICD-10-CM | POA: Diagnosis not present

## 2021-07-02 DIAGNOSIS — H532 Diplopia: Secondary | ICD-10-CM | POA: Diagnosis not present

## 2021-07-02 DIAGNOSIS — R5383 Other fatigue: Secondary | ICD-10-CM

## 2021-07-02 DIAGNOSIS — R531 Weakness: Secondary | ICD-10-CM | POA: Diagnosis not present

## 2021-07-02 LAB — BASIC METABOLIC PANEL
Anion gap: 7 (ref 5–15)
BUN: 10 mg/dL (ref 6–20)
CO2: 28 mmol/L (ref 22–32)
Calcium: 9.4 mg/dL (ref 8.9–10.3)
Chloride: 104 mmol/L (ref 98–111)
Creatinine, Ser: 0.79 mg/dL (ref 0.44–1.00)
GFR, Estimated: >60 mL/min (ref >60)
Glucose, Bld: 107 mg/dL — ABNORMAL HIGH (ref 70–99)
Potassium: 3.9 mmol/L (ref 3.5–5.1)
Sodium: 139 mmol/L (ref 135–145)

## 2021-07-02 LAB — URINALYSIS, ROUTINE W REFLEX MICROSCOPIC
Bilirubin Urine: NEGATIVE
Glucose, UA: NEGATIVE mg/dL
Hgb urine dipstick: NEGATIVE
Ketones, ur: 5 mg/dL — AB
Leukocytes,Ua: NEGATIVE
Nitrite: NEGATIVE
Protein, ur: NEGATIVE mg/dL
Specific Gravity, Urine: 1.036 — ABNORMAL HIGH (ref 1.005–1.030)
pH: 6 (ref 5.0–8.0)

## 2021-07-02 LAB — CBC WITH DIFFERENTIAL/PLATELET
Abs Immature Granulocytes: 0.03 10*3/uL (ref 0.00–0.07)
Basophils Absolute: 0 10*3/uL (ref 0.0–0.1)
Basophils Relative: 1 %
Eosinophils Absolute: 0 10*3/uL (ref 0.0–0.5)
Eosinophils Relative: 1 %
HCT: 40.1 % (ref 36.0–46.0)
Hemoglobin: 12.7 g/dL (ref 12.0–15.0)
Immature Granulocytes: 0 %
Lymphocytes Relative: 14 %
Lymphs Abs: 1.1 10*3/uL (ref 0.7–4.0)
MCH: 29.7 pg (ref 26.0–34.0)
MCHC: 31.7 g/dL (ref 30.0–36.0)
MCV: 93.9 fL (ref 80.0–100.0)
Monocytes Absolute: 0.4 10*3/uL (ref 0.1–1.0)
Monocytes Relative: 5 %
Neutro Abs: 6.1 10*3/uL (ref 1.7–7.7)
Neutrophils Relative %: 79 %
Platelets: 276 10*3/uL (ref 150–400)
RBC: 4.27 MIL/uL (ref 3.87–5.11)
RDW: 15 % (ref 11.5–15.5)
WBC: 7.6 10*3/uL (ref 4.0–10.5)
nRBC: 0 % (ref 0.0–0.2)

## 2021-07-02 LAB — CBC
HCT: 39.7 % (ref 36.0–46.0)
Hemoglobin: 12 g/dL (ref 12.0–15.0)
MCH: 28.7 pg (ref 26.0–34.0)
MCHC: 30.2 g/dL (ref 30.0–36.0)
MCV: 95 fL (ref 80.0–100.0)
Platelets: 280 10*3/uL (ref 150–400)
RBC: 4.18 MIL/uL (ref 3.87–5.11)
RDW: 15 % (ref 11.5–15.5)
WBC: 7.6 10*3/uL (ref 4.0–10.5)
nRBC: 0 % (ref 0.0–0.2)

## 2021-07-02 LAB — TROPONIN I (HIGH SENSITIVITY)
Troponin I (High Sensitivity): 3 ng/L (ref <18)
Troponin I (High Sensitivity): 3 ng/L

## 2021-07-02 LAB — HEMOGLOBIN A1C
Hgb A1c MFr Bld: 5.6 % (ref 4.8–5.6)
Mean Plasma Glucose: 114.02 mg/dL

## 2021-07-02 LAB — RESP PANEL BY RT-PCR (FLU A&B, COVID) ARPGX2
Influenza A by PCR: NEGATIVE
Influenza B by PCR: NEGATIVE
SARS Coronavirus 2 by RT PCR: NEGATIVE

## 2021-07-02 LAB — APTT: aPTT: 27 s (ref 24–36)

## 2021-07-02 LAB — CBG MONITORING, ED: Glucose-Capillary: 108 mg/dL — ABNORMAL HIGH (ref 70–99)

## 2021-07-02 LAB — I-STAT CHEM 8, ED
BUN: 15 mg/dL (ref 6–20)
Calcium, Ion: 1.16 mmol/L (ref 1.15–1.40)
Chloride: 104 mmol/L (ref 98–111)
Creatinine, Ser: 0.8 mg/dL (ref 0.44–1.00)
Glucose, Bld: 108 mg/dL — ABNORMAL HIGH (ref 70–99)
HCT: 38 % (ref 36.0–46.0)
Hemoglobin: 12.9 g/dL (ref 12.0–15.0)
Potassium: 5.1 mmol/L (ref 3.5–5.1)
Sodium: 141 mmol/L (ref 135–145)
TCO2: 29 mmol/L (ref 22–32)

## 2021-07-02 LAB — I-STAT BETA HCG BLOOD, ED (MC, WL, AP ONLY): I-stat hCG, quantitative: 6.1 m[IU]/mL — ABNORMAL HIGH (ref <5)

## 2021-07-02 LAB — HIV ANTIBODY (ROUTINE TESTING W REFLEX): HIV Screen 4th Generation wRfx: NONREACTIVE

## 2021-07-02 LAB — CREATININE, SERUM
Creatinine, Ser: 0.89 mg/dL (ref 0.44–1.00)
GFR, Estimated: >60 mL/min (ref >60)

## 2021-07-02 LAB — PROTIME-INR
INR: 1 (ref 0.8–1.2)
Prothrombin Time: 13 s (ref 11.4–15.2)

## 2021-07-02 MED ORDER — SODIUM CHLORIDE 0.9 % IV BOLUS
1000.0000 mL | Freq: Once | INTRAVENOUS | Status: AC
  Administered 2021-07-02: 1000 mL via INTRAVENOUS

## 2021-07-02 MED ORDER — AMLODIPINE BESYLATE 5 MG PO TABS
10.0000 mg | ORAL_TABLET | Freq: Every day | ORAL | Status: DC
  Administered 2021-07-02 – 2021-07-03 (×2): 10 mg via ORAL
  Filled 2021-07-02 (×2): qty 2

## 2021-07-02 MED ORDER — ONDANSETRON HCL 4 MG/2ML IJ SOLN
4.0000 mg | Freq: Four times a day (QID) | INTRAMUSCULAR | Status: DC | PRN
  Administered 2021-07-02: 4 mg via INTRAVENOUS
  Filled 2021-07-02: qty 2

## 2021-07-02 MED ORDER — KETOROLAC TROMETHAMINE 30 MG/ML IJ SOLN
30.0000 mg | Freq: Once | INTRAMUSCULAR | Status: AC
  Administered 2021-07-02: 30 mg via INTRAVENOUS
  Filled 2021-07-02: qty 1

## 2021-07-02 MED ORDER — IOHEXOL 350 MG/ML SOLN
50.0000 mL | Freq: Once | INTRAVENOUS | Status: AC | PRN
  Administered 2021-07-02: 50 mL via INTRAVENOUS

## 2021-07-02 MED ORDER — ACETAMINOPHEN 650 MG RE SUPP: 650.0000 mg | Freq: Four times a day (QID) | RECTAL | Status: DC | PRN

## 2021-07-02 MED ORDER — SENNOSIDES-DOCUSATE SODIUM 8.6-50 MG PO TABS: 1.0000 | ORAL_TABLET | Freq: Every evening | ORAL | Status: DC | PRN

## 2021-07-02 MED ORDER — ONDANSETRON HCL 4 MG PO TABS: 4.0000 mg | ORAL_TABLET | Freq: Four times a day (QID) | ORAL | Status: DC | PRN

## 2021-07-02 MED ORDER — HYDRALAZINE HCL 20 MG/ML IJ SOLN: 10.0000 mg | Freq: Four times a day (QID) | INTRAMUSCULAR | Status: DC | PRN

## 2021-07-02 MED ORDER — ONDANSETRON HCL 4 MG/2ML IJ SOLN
4.0000 mg | Freq: Once | INTRAMUSCULAR | Status: AC
  Administered 2021-07-02: 4 mg via INTRAVENOUS
  Filled 2021-07-02: qty 2

## 2021-07-02 MED ORDER — ACETAMINOPHEN 325 MG PO TABS
650.0000 mg | ORAL_TABLET | Freq: Four times a day (QID) | ORAL | Status: DC | PRN
  Administered 2021-07-03: 650 mg via ORAL
  Filled 2021-07-02: qty 2

## 2021-07-02 MED ORDER — MECLIZINE HCL 25 MG PO TABS
25.0000 mg | ORAL_TABLET | Freq: Once | ORAL | Status: AC
  Administered 2021-07-02: 25 mg via ORAL
  Filled 2021-07-02: qty 1

## 2021-07-02 MED ORDER — ENOXAPARIN SODIUM 40 MG/0.4ML IJ SOSY
40.0000 mg | PREFILLED_SYRINGE | INTRAMUSCULAR | Status: DC
  Administered 2021-07-02: 40 mg via SUBCUTANEOUS
  Filled 2021-07-02: qty 0.4

## 2021-07-02 MED ORDER — CLOPIDOGREL BISULFATE 75 MG PO TABS
75.0000 mg | ORAL_TABLET | Freq: Every day | ORAL | Status: DC
  Administered 2021-07-02 – 2021-07-03 (×2): 75 mg via ORAL
  Filled 2021-07-02 (×2): qty 1

## 2021-07-02 MED ORDER — ASPIRIN 81 MG PO CHEW
81.0000 mg | CHEWABLE_TABLET | Freq: Every day | ORAL | Status: DC
  Administered 2021-07-03: 81 mg via ORAL
  Filled 2021-07-02 (×2): qty 1

## 2021-07-02 MED ORDER — MECLIZINE HCL 25 MG PO TABS
25.0000 mg | ORAL_TABLET | Freq: Three times a day (TID) | ORAL | Status: DC
  Administered 2021-07-02 – 2021-07-03 (×2): 25 mg via ORAL
  Filled 2021-07-02 (×2): qty 1

## 2021-07-02 NOTE — Code Documentation (Signed)
Code stroke called for generalized patient symptoms. Patient transported to CT for CT, CTA. MRI requested however, patient has permanent jewelry and unable to remove at this time. Patient returned to ED room. Continue to monitor Q2 hour NIHSS and vital signs. Plan verbalized with Camryn, ED RN.

## 2021-07-02 NOTE — Consult Note (Signed)
Neurology Consultation  Reason for Consult: Acute onset of speech disturbance, dizziness, diffuse weakness, and nystagmus Referring Physician: Dr. Renaye Rakers  CC: Dizziness  History is obtained from: Patient, patient's co-worker at bedside, chart review  HPI: Elizabeth [name] is a 60 y.o. female with a medical history significant for arthritis, atrial fibrillation, essential hypertension, hypercholesterolemia, and current estrogen supplementation for menopausal symptoms who presented to the ED from work for evaluation of acute onset of dizziness, nausea, tinging in her fingers, speech disturbance, diaphoresis, diplopia, and a rightward lean. Patient was at work at 07:00 this morning in her normal state of health. She states that suddenly she became dizzy while she was sitting at her computer with associated nausea and diplopia worse with rightward gaze. Her coworker noted that she left the office briefly and when she returned, the patient had spilled food from her mouth down the front of her shirt with mumbling speech that could not understand and then sudden onset of diaphoresis and "heaviness" in her limbs. Her coworker states that she then became unresponsive and did not respond to shaking briefly and that she could not tolerate position changes without significant diaphoresis.   LKW: 09:00 TNK given?: no, presentation is felt to be consistent with vertigo and less suspicious for acute stroke IR Thrombectomy? No, angiography imaging reviewed by neurology attending without evidence of LVO Modified Rankin Scale: 0-Completely asymptomatic and back to baseline post- stroke  ROS: A complete ROS was performed and is negative except as noted in the HPI.   Past Medical History:  Diagnosis Date   Arthritis    ASCUS (atypical squamous cells of undetermined significance) on Pap smear 99,04, 4/10   Atrial fibrillation (HCC)    Chest pain 03/05/2013   Colon polyps    Hypertension    Shortness of breath  03/06/2013   " i had shortness of breath with my chest pain "   Past Surgical History:  Procedure Laterality Date   CARDIAC CATHETERIZATION  3/09   TONSILLECTOMY     TUBAL LIGATION     Family History  Problem Relation Age of Onset   Hypertension Mother    Diabetes Mother    Heart disease Mother    Heart disease Father    Cancer Father        stomach   Hypertension Father    Heart disease Maternal Grandmother    Hypertension Sister    Heart disease Brother    Diabetes Brother    Hypertension Brother    Social History:   reports that she has never smoked. She has never used smokeless tobacco. She reports current alcohol use. She reports that she does not use drugs.  Medications  Current Facility-Administered Medications:    aspirin chewable tablet 81 mg, 81 mg, Oral, Daily, Orine Goga, Loel Dubonnet, MD   clopidogrel (PLAVIX) tablet 75 mg, 75 mg, Oral, Daily, Rica Mote, MD, 75 mg at 07/02/21 1312  Current Outpatient Medications:    aspirin EC 81 MG tablet, Take 81 mg by mouth daily., Disp: , Rfl:    atorvastatin (LIPITOR) 10 MG tablet, TAKE 1 TABLET BY MOUTH DAILY FOR CHOLESTEROL, Disp: 90 tablet, Rfl: 3   atorvastatin (LIPITOR) 10 MG tablet, Take 1 tablet (10 mg total) by mouth daily for cholesterol, Disp: 90 tablet, Rfl: 3   ATORVASTATIN CALCIUM PO, Take by mouth., Disp: , Rfl:    diclofenac (VOLTAREN) 75 MG EC tablet, TAKE 1 TABLET BY MOUTH TWICE A DAY WITH MEALS, Disp: 60 tablet,  Rfl: 2   doxepin (SINEQUAN) 10 MG capsule, TAKE 2 CAPSULES BY MOUTH BEFORE BED AS NEEDED FOR SLEEP, Disp: 180 capsule, Rfl: 3   doxepin (SINEQUAN) 10 MG capsule, Take 2 capsules (20 mg total) by mouth daily before bed as needed for sleep, Disp: 180 capsule, Rfl: 3   estradiol (ESTRACE) 0.5 MG tablet, Take 1 tablet (0.5 mg total) by mouth daily., Disp: 90 tablet, Rfl: 4   lisinopril-hydrochlorothiazide (ZESTORETIC) 20-12.5 MG tablet, Take 1 tablet by mouth daily for blood pressure., Disp: 90 tablet,  Rfl: 0   lisinopril-hydrochlorothiazide (ZESTORETIC) 20-12.5 MG tablet, Take 1 tablet by mouth daily for blood pressure, Disp: 90 tablet, Rfl: 3   progesterone (PROMETRIUM) 100 MG capsule, Take 1 capsule (100 mg total) by mouth at bedtime., Disp: 90 capsule, Rfl: 4  Exam: Current vital signs: BP (!) 188/76   Pulse (!) 56   Temp 98 F (36.7 C) (Oral)   Resp 16   Ht 5\' 5"  (1.651 m)   Wt 104.3 kg   LMP 10/02/2013   SpO2 97%   BMI 38.27 kg/m  Vital signs in last 24 hours: Temp:  [98 F (36.7 C)] 98 F (36.7 C) (10/13 1025) Pulse Rate:  [47-58] 56 (10/13 1315) Resp:  [10-18] 16 (10/13 1315) BP: (171-198)/(75-110) 188/76 (10/13 1315) SpO2:  [95 %-99 %] 97 % (10/13 1315) Weight:  [104.3 kg] 104.3 kg (10/13 1027)  GENERAL: Awake, alert, in no acute distress Psych: Affect appropriate for situation, patient is calm and cooperative with examination Head: Normocephalic and atraumatic, without obvious abnormality EENT: Normal conjunctivae, dry mucous membranes, no OP obstruction, wears eyeglasses LUNGS: Normal respiratory effort. Non-labored breathing on room air. CV: Bradycardia on cardiac monitor, no pedal edema noted ABDOMEN: Soft, non-tender, non-distended Extremities: warm, well perfused, without obvious deformity  NEURO:  Mental Status: Awake, alert, and oriented to person, place, time, and situation. She is able to provide a clear and coherent history of present illness. Speech/Language: speech is intact without dysarthria.   Naming, repetition, fluency, and comprehension intact without aphasia  No neglect is noted Cranial Nerves:  II: PERRL. Visual fields full.  III, IV, VI: EOMI with inconsistent diplopia reported in leftward gaze.  V: Sensation is intact to light touch and symmetrical to face.  VII: Face is symmetric resting and smiling with minimal mouth elevation bilaterally with smiling.  VIII: Hearing is intact to voice IX, X: Palate elevation is symmetric. Phonation  normal.  XI: Normal sternocleidomastoid and trapezius muscle strength XII: Tongue protrudes midline without fasciculations.   Motor: There is some weakness noted throughout with minimal drift in bilateral upper extremities.  Right lower extremity strength is somewhat limited due to pain with reported bone spur but she is able to elevate it minimally without vertical drift.  Left lower extremity is able to elevate against gravity without vertical drift.  Tone is normal. Bulk is normal.  Sensation: Intact to light touch bilaterally in all four extremities. No extinction to DSS present.  Coordination: No dysmetria noted Plantars: Toes downgoing bilaterally. Gait: Deferred  NIHSS: 1a Level of Conscious.: 0 1b LOC Questions: 0 1c LOC Commands: 0 2 Best Gaze: 0 3 Visual: 0 4 Facial Palsy: 0 5a Motor Arm - left: 1 5b Motor Arm - Right: 1 6a Motor Leg - Left: 0 6b Motor Leg - Right: 0 7 Limb Ataxia: 0 8 Sensory: 0 9 Best Language: 0 10 Dysarthria: 0 11 Extinct. and Inatten.: 0 TOTAL: 2  Labs I have reviewed  labs in epic and the results pertinent to this consultation are: CBC    Component Value Date/Time   WBC 7.6 07/02/2021 1046   RBC 4.27 07/02/2021 1046   HGB 12.9 07/02/2021 1141   HCT 38.0 07/02/2021 1141   PLT 276 07/02/2021 1046   MCV 93.9 07/02/2021 1046   MCV 94.8 05/21/2012 1705   MCH 29.7 07/02/2021 1046   MCHC 31.7 07/02/2021 1046   RDW 15.0 07/02/2021 1046   LYMPHSABS 1.1 07/02/2021 1046   MONOABS 0.4 07/02/2021 1046   EOSABS 0.0 07/02/2021 1046   BASOSABS 0.0 07/02/2021 1046   CMP     Component Value Date/Time   NA 141 07/02/2021 1141   K 5.1 07/02/2021 1141   CL 104 07/02/2021 1141   CO2 28 07/02/2021 1046   GLUCOSE 108 (H) 07/02/2021 1141   BUN 15 07/02/2021 1141   CREATININE 0.80 07/02/2021 1141   CALCIUM 9.4 07/02/2021 1046   PROT 8.6 (H) 09/29/2013 1407   ALBUMIN 4.0 09/29/2013 1407   AST 15 09/29/2013 1407   ALT 14 09/29/2013 1407   ALKPHOS  112 09/29/2013 1407   BILITOT 0.9 09/29/2013 1407   GFRNONAA >60 07/02/2021 1046   GFRAA >90 09/29/2013 1407   Lipid Panel     Component Value Date/Time   CHOL 146 03/06/2013 0310   TRIG 88 03/06/2013 0310   HDL 42 03/06/2013 0310   CHOLHDL 3.5 03/06/2013 0310   VLDL 18 03/06/2013 0310   LDLCALC 86 03/06/2013 0310   Lab Results  Component Value Date   HGBA1C 5.3 03/06/2013   Imaging I have reviewed the images obtained:  CT-scan of the brain 10/13: 1. Negative for cerebral hemorrhage. 15 mm hypodensity right frontal white matter which could be artifact or infarct of indeterminate age. 2. ASPECTS is 10  CT angio head and neck wwo 10/13: Negative CT angiogram head neck. No intracranial extracranial stenosis Negative for intracranial large vessel occlusion.  Assessment: 60 y.o. 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  - Examination reveals patient with dizziness, no nystagmus, inconsistent diplopia in leftward gaze, and bilateral upper extremity drift on assessment. Initial NIHSS of 2 for BUE drift.  - CT imaging was obtained without evidence of hemorrhage but with a 15 mm hypodensity within the right frontal white matter that could represent an age indeterminate infarct versus artifact though location of hypodensity is not felt to correlate with presenting symptoms. Attempted to obtain MRI brain but patient had a bracelet that she did not want to be cut off and she did not have the tools to unscrew during neurology assessment.  - Presentation is felt to likely be related to vertigo with a reported history of vertigo (but without the same intensity as today). With further evaluation, there is low suspicion for stroke without diplopia, nystagmus, and with improvement  in dizziness with rest, though patient does have stroke risk factors including history of HTN, HLD, and chronic estrogen use for post-menopausal symptoms.   Recommendations: - If able to remove patient's jewelry, would recommend MRI brain for further evaluation of possible infarct versus artifact on CTH. - If MRI brain is positive, will expand stroke work up at that time - Stroke prophylaxis: continue ASA and will add clopidogrel 75 mg PO daily for 21 days - PT  evaluation for vestibular rehab   Lanae Boast, AGAC-NP [facility] Pager: [phone]  Attending Attestation:  Patient seen, examined, labs,vitals and notes reviewed. Discussed plan with Lanae Boast, PA and agree with assessment and plan as documented above. I have independently reviewed the chart, obtained history, review of systems and examined the patient.  Electronically signed by:  Marisue Humble, MD Page: [phone] 07/02/2021, 7:14 PM

## 2021-07-02 NOTE — ED Notes (Signed)
The patient's jewelry was placed into denture cups as well as a red biohazard bag, all with corresponding patient labels. The patient's support person has her belongings.

## 2021-07-02 NOTE — ED Provider Notes (Signed)
[facility] EMERGENCY DEPARTMENT Provider Note   CSN: [id number] Arrival date & time: 07/02/21  1006     History Chief Complaint  Patient presents with   Dizziness    Elizabeth [name] is a 60 y.o. female with history of hypertension, high cholesterol, chronic estrogen use for post-menopausal symptoms (HRT), presented emergency department abrupt onset of vertigo, generalized weakness, lethargy.  The patient reports that she woke up in her usual state of health today.  She was feeling fine today in the past several days.  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  She reports that her arms and legs feel very heavy.  She also reports double vision worse with rightward gaze.  She says she has never had these symptoms before.  She denies any chest pain or pressure.  She denies any headache.  She denies any neck pain.  She denies any numbness in her face, arms, or legs, but reports that her arms and legs feel "heavy".  She does report a prior history of vertigo but cannot provide further details about this.  She denies any history of TIA, stroke, smoking.  She does report a history of high blood pressure and high cholesterol, for which she is on medications.  HPI     Past Medical History:  Diagnosis Date   Arthritis    ASCUS (atypical squamous cells of undetermined significance) on Pap smear 99,04, 4/10   Atrial fibrillation (HCC)    Chest pain 03/05/2013   Colon polyps    Hypertension    Shortness of breath 03/06/2013   " i had shortness of breath with my chest pain "    Patient Active Problem List   Diagnosis Date Noted   Colon polyps    Atrial fibrillation [facility])     Past Surgical History:  Procedure Laterality Date   CARDIAC CATHETERIZATION  3/09   TONSILLECTOMY     TUBAL LIGATION       OB History     Gravida  4    Para  3   Term  3   Preterm      AB  1   Living  3      SAB  1   IAB      Ectopic      Multiple      Live Births              Family History  Problem Relation Age of Onset   Hypertension Mother    Diabetes Mother    Heart disease Mother    Heart disease Father    Cancer Father        stomach   Hypertension Father    Heart disease Maternal Grandmother    Hypertension Sister    Heart disease Brother    Diabetes Brother    Hypertension Brother     Social History   Tobacco Use   Smoking status: Never   Smokeless tobacco: Never  Vaping Use   Vaping Use: Never used  Substance Use Topics   Alcohol use: Yes    Alcohol/week: 0.0 standard drinks    Comment: rare   Drug use: No    Home Medications Prior to Admission medications   Medication Sig Start Date End Date Taking? Authorizing Provider  aspirin EC 81 MG tablet Take 81 mg by mouth daily.  [provider]  atorvastatin (LIPITOR) 10 MG tablet TAKE 1 TABLET BY MOUTH DAILY FOR CHOLESTEROL 01/04/20 01/03/21  Blair Heys, MD  atorvastatin (LIPITOR) 10 MG tablet Take 1 tablet (10 mg total) by mouth daily for cholesterol 03/03/21     ATORVASTATIN CALCIUM PO Take by mouth.    [provider]  diclofenac (VOLTAREN) 75 MG EC tablet TAKE 1 TABLET BY MOUTH TWICE A DAY WITH MEALS 10/23/20 10/23/21  Chadwell, Ivin Booty, PA-C  doxepin (SINEQUAN) 10 MG capsule TAKE 2 CAPSULES BY MOUTH BEFORE BED AS NEEDED FOR SLEEP 01/04/20 01/03/21  Blair Heys, MD  doxepin (SINEQUAN) 10 MG capsule Take 2 capsules (20 mg total) by mouth daily before bed as needed for sleep 03/03/21     estradiol (ESTRACE) 0.5 MG tablet Take 1 tablet (0.5 mg total) by mouth daily. 02/27/21   Genia Del, MD  lisinopril-hydrochlorothiazide (ZESTORETIC) 20-12.5 MG tablet Take 1 tablet by mouth daily for blood pressure. 12/16/20   Blair Heys, MD  lisinopril-hydrochlorothiazide (ZESTORETIC) 20-12.5 MG tablet Take 1 tablet by  mouth daily for blood pressure 03/03/21     progesterone (PROMETRIUM) 100 MG capsule Take 1 capsule (100 mg total) by mouth at bedtime. 02/27/21   Genia Del, MD    Allergies    Patient has no known allergies.  Review of Systems   Review of Systems  Constitutional:  Negative for chills and fever.  Eyes:  Positive for visual disturbance. Negative for photophobia.  Respiratory:  Negative for cough and shortness of breath.   Cardiovascular:  Negative for chest pain and palpitations.  Gastrointestinal:  Positive for nausea and vomiting.  Genitourinary:  Negative for dysuria and hematuria.  Musculoskeletal:  Negative for arthralgias and back pain.  Skin:  Negative for color change and rash.  Neurological:  Positive for dizziness and light-headedness. Negative for syncope, facial asymmetry, weakness, numbness and headaches.  All other systems reviewed and are negative.  Physical Exam Updated Vital Signs BP (!) 184/75 (BP Location: Right Arm)   Pulse (!) 54   Temp 98 F (36.7 C) (Oral)   Resp 16   LMP 10/02/2013   SpO2 97%   Physical Exam Constitutional:      Appearance: She is obese.     Comments: Appears tired, eyes closed  HENT:     Head: Normocephalic and atraumatic.  Eyes:     General: No visual field deficit.    Conjunctiva/sclera: Conjunctivae normal.     Pupils: Pupils are equal, round, and reactive to light.  Cardiovascular:     Rate and Rhythm: Normal rate and regular rhythm.     Pulses: Normal pulses.  Pulmonary:     Effort: Pulmonary effort is normal. No respiratory distress.  Abdominal:     General: There is no distension.     Tenderness: There is no abdominal tenderness.  Skin:    General: Skin is warm and dry.  Neurological:     Mental Status: She is alert. Mental status is at baseline.     GCS: GCS eye subscore is 4. GCS verbal subscore is 5. GCS motor subscore is 6.     Cranial Nerves: No cranial nerve deficit, dysarthria or facial asymmetry.      Sensory: Sensation is intact.     Comments: Unilateral nystagmus (mild) with rightward gaze, reporting double vision with rightward gaze Weak effort on upper and lower strength testing bilaterally, equal grip strength, 4/5 strength in bilateral legs (hip flexion)    ED Results / Procedures /  Treatments   Labs (all labs ordered are listed, but only abnormal results are displayed) Labs Reviewed - No data to display  EKG None  Radiology No results found.  Procedures Procedures   Medications Ordered in ED Medications - No data to display  ED Course  I have reviewed the triage vital signs and the nursing notes.  Pertinent labs & imaging results that were available during my care of the patient were reviewed by me and considered in my medical decision making (see chart for details).  Patient is here with abrupt onset fatigue, nausea, vertigo.  Differential diagnosis includes peripheral vertigo versus atypical ACS versus infection versus CVA vs other  Labs reviewed.  No evidence of UTI, COVID-negative.  CMP and CBC unremarkable.  Troponins are low and flat, 3 and 3.   Clinical Course as of 07/02/21 1555  Thu Jul 02, 2021  1118 I spoke to Dr Thomasena Edis neurology who will evaluate pt; it is unclear if these symptoms could be consistent with a posterior stroke; he requested a stat MRI, which I've ordered (MRI technician says she will be next in line, but active scans ongoing).  Neuro to see pt [MT]  1130 Neurology at bedside, requesting that the patient be activated as CODE STROKE, which is in process.  MRI reports pt next in line for scanning. [MT]  1135 Glucose-Capillary(!): 108 [MT]  1142 Pt taken for CT now - CTH, CTA head and neck recommended by Dr Thomasena Edis [MT]  1206 Troponin I (High Sensitivity): 3 [MT]  1211 Suspect hcg preg test false negative [MT]  1535 Pt reporting continued diffuse weakness or arms and legs, frontal headache.  IV toradol and IV ordered for headache.  HR now in  50's which she reports is "slow" for her, and she reports hx of needing to be "paced for bradycardia" - will admit for tele monitoring and echo [MT]  1537 No acute infarct on MR imaging [MT]  1553 Admitted to hospitalist [MT]  1554 She remains afebrile, no leukocytosis or white count to suspect acute infection or sepsis.  COVID and flu are negative.  PE is also less likely with no hypoxia, no tachypnea, no tachycardia, and unremarkable troponins. [MT]    Clinical Course User Index [MT] Janiece Scovill, Kermit Balo, MD    Final Clinical Impression(s) / ED Diagnoses Final diagnoses:  None    Rx / DC Orders ED Discharge Orders     None        Terald Sleeper, MD 07/02/21 1556

## 2021-07-02 NOTE — H&P (Signed)
TRH H&P   Patient Demographics:    Taliana Mersereau, is a 60 y.o. female  MRN: [id number]   DOB - [birth year]  Admit Date - 07/02/2021  Outpatient Primary MD for the patient is Blair Heys, MD    Patient coming from: [facility] ER  Chief Complaint  Patient presents with   Dizziness      HPI:    Madailein Londo  is a 59 y.o. 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  Patient currently has mild generalized headache, no fever chills, mild nausea, no focal weakness, no chest pain or palpitations, no shortness of breath or cough, no diarrhea, no blood in stool or urine no dysuria.  No other positive review of systems at this time.   Review of systems:    A full 10 point Review of Systems was done, except as stated above, all other Review of Systems were negative.   With Past History of the following :    Past Medical History:  Diagnosis Date   Arthritis    ASCUS (atypical squamous cells of undetermined significance) on Pap smear 99,04, 4/10   Atrial fibrillation (HCC)    Chest pain 03/05/2013   Colon polyps    Hypertension    Shortness of breath 03/06/2013   " i had shortness of breath with my chest pain "      Past Surgical History:  Procedure Laterality Date   CARDIAC  CATHETERIZATION  3/09   TONSILLECTOMY     TUBAL LIGATION        Social History:     Social History   Tobacco Use   Smoking status: Never   Smokeless tobacco: Never  Substance Use Topics   Alcohol use: Yes    Alcohol/week: 0.0 standard drinks    Comment: rare         Family History :     Family History  Problem Relation Age  of Onset   Hypertension Mother    Diabetes Mother    Heart disease Mother    Heart disease Father    Cancer Father        stomach   Hypertension Father    Heart disease Maternal Grandmother    Hypertension Sister    Heart disease Brother    Diabetes Brother    Hypertension Brother        Home Medications:   Prior to Admission medications   Medication Sig Start Date End Date Taking? Authorizing Provider  aspirin EC 81 MG tablet Take 81 mg by mouth daily.    [provider]  atorvastatin (LIPITOR) 10 MG tablet TAKE 1 TABLET BY MOUTH DAILY FOR CHOLESTEROL 01/04/20 01/03/21  Blair Heys, MD  atorvastatin (LIPITOR) 10 MG tablet Take 1 tablet (10 mg total) by mouth daily for cholesterol 03/03/21     ATORVASTATIN CALCIUM PO Take by mouth.    [provider]  doxepin (SINEQUAN) 10 MG capsule TAKE 2 CAPSULES BY MOUTH BEFORE BED AS NEEDED FOR SLEEP 01/04/20 01/03/21  Blair Heys, MD  doxepin (SINEQUAN) 10 MG capsule Take 2 capsules (20 mg total) by mouth daily before bed as needed for sleep 03/03/21     estradiol (ESTRACE) 0.5 MG tablet Take 1 tablet (0.5 mg total) by mouth daily. 02/27/21   Genia Del, MD  lisinopril-hydrochlorothiazide (ZESTORETIC) 20-12.5 MG tablet Take 1 tablet by mouth daily for blood pressure. 12/16/20   Blair Heys, MD  lisinopril-hydrochlorothiazide (ZESTORETIC) 20-12.5 MG tablet Take 1 tablet by mouth daily for blood pressure 03/03/21     progesterone (PROMETRIUM) 100 MG capsule Take 1 capsule (100 mg total) by mouth at bedtime. 02/27/21   Genia Del, MD     Allergies:    No Known  Allergies   Physical Exam:   Vitals  Blood pressure (!) 186/78, pulse (!) 49, temperature 98 F (36.7 C), temperature source Oral, resp. rate 15, height 5\' 5"  (1.651 m), weight 104.3 kg, last menstrual period 10/02/2013, SpO2 97 %.   1. General middle aged obese African-American female lying in hospital bed in no distress  2. Normal affect and insight, Not Suicidal or Homicidal, Awake Alert,   3. No F.N deficits, ALL C.Nerves Intact, Strength 5/5 all 4 extremities, Sensation intact all 4 extremities, Plantars down going.  4. Ears and Eyes appear Normal, Conjunctivae clear, PERRLA. Moist Oral Mucosa.  Mild nystagmus  5. Supple Neck, No JVD, No cervical lymphadenopathy appriciated, No Carotid Bruits.  6. Symmetrical Chest wall movement, Good air movement bilaterally, CTAB.  7. RRR, No Gallops, Rubs or Murmurs, No Parasternal Heave.  8. Positive Bowel Sounds, Abdomen Soft, No tenderness, No organomegaly appriciated,No rebound -guarding or rigidity.  9.  No Cyanosis, Normal Skin Turgor, No Skin Rash or Bruise.  10. Good muscle tone,  joints appear normal , no effusions, Normal ROM.  11. No Palpable Lymph Nodes in Neck or Axillae     Data Review:    CBC Recent Labs  Lab 07/02/21 1046 07/02/21 1141  WBC 7.6  --   HGB 12.7 12.9  HCT 40.1 38.0  PLT 276  --   MCV 93.9  --   MCH 29.7  --   MCHC 31.7  --   RDW 15.0  --   LYMPHSABS 1.1  --   MONOABS 0.4  --   EOSABS 0.0  --   BASOSABS 0.0  --    ------------------------------------------------------------------------------------------------------------------  Chemistries  Recent Labs  Lab  07/02/21 1046 07/02/21 1141  NA 139 141  K 3.9 5.1  CL 104 104  CO2 28  --   GLUCOSE 107* 108*  BUN 10 15  CREATININE 0.79 0.80  CALCIUM 9.4  --    ------------------------------------------------------------------------------------------------------------------ estimated creatinine clearance is 90.7 mL/min (by C-G formula  based on SCr of 0.8 mg/dL). ------------------------------------------------------------------------------------------------------------------ No results for input(s): TSH, T4TOTAL, T3FREE, THYROIDAB in the last 72 hours.  Invalid input(s): FREET3  Coagulation profile Recent Labs  Lab 07/02/21 1348  INR 1.0   ------------------------------------------------------------------------------------------------------------------- No results for input(s): DDIMER in the last 72 hours. -------------------------------------------------------------------------------------------------------------------  Cardiac Enzymes No results for input(s): CKMB, TROPONINI, MYOGLOBIN in the last 168 hours.  Invalid input(s): CK ------------------------------------------------------------------------------------------------------------------ No results found for: BNP   ---------------------------------------------------------------------------------------------------------------  Urinalysis    Component Value Date/Time   COLORURINE YELLOW 07/02/2021 1348   APPEARANCEUR CLEAR 07/02/2021 1348   LABSPEC 1.036 (H) 07/02/2021 1348   PHURINE 6.0 07/02/2021 1348   GLUCOSEU NEGATIVE 07/02/2021 1348   HGBUR NEGATIVE 07/02/2021 1348   BILIRUBINUR NEGATIVE 07/02/2021 1348   KETONESUR 5 (A) 07/02/2021 1348   PROTEINUR NEGATIVE 07/02/2021 1348   UROBILINOGEN 0.2 11/30/2013 1656   NITRITE NEGATIVE 07/02/2021 1348   LEUKOCYTESUR NEGATIVE 07/02/2021 1348    ----------------------------------------------------------------------------------------------------------------   Imaging Results:    MR BRAIN WO CONTRAST  Result Date: 07/02/2021 CLINICAL DATA:  Transient ischemic attack (TIA) Abrupt onset vertigo, diplopia, evaluate for posterior infarct EXAM: MRI HEAD WITHOUT CONTRAST TECHNIQUE: Multiplanar, multiecho pulse sequences of the brain and surrounding structures were obtained without intravenous contrast.  COMPARISON:  None. FINDINGS: Brain: There is no acute infarction or intracranial hemorrhage. There is no intracranial mass, mass effect, or edema. There is no hydrocephalus or extra-axial fluid collection. Ventricles and sulci are normal in size and configuration. Patchy foci of T2 hyperintensity in the supratentorial white matter are nonspecific but may reflect mild chronic microvascular ischemic changes. Vascular: Major vessel flow voids at the skull base are preserved. Skull and upper cervical spine: Normal marrow signal is preserved. Sinuses/Orbits: Paranasal sinuses are aerated. Orbits are unremarkable. Other: Sella is unremarkable.  Mastoid air cells are clear. IMPRESSION: No acute infarction, hemorrhage, or mass. Mild probable chronic microvascular ischemic changes. Electronically Signed   By: Guadlupe Spanish M.D.   On: 07/02/2021 15:15   CT HEAD CODE STROKE WO CONTRAST  Result Date: 07/02/2021 CLINICAL DATA:  Code stroke. Acute neuro deficit. Dizziness and weakness EXAM: CT HEAD WITHOUT CONTRAST TECHNIQUE: Contiguous axial images were obtained from the base of the skull through the vertex without intravenous contrast. COMPARISON:  CT head 03/05/2013 FINDINGS: Brain: Proximally 15 mm hypodensity right frontal white matter best seen on axial image 21. This is not seen previously. This could be artifact or possibly acute or chronic ischemia. Negative for acute hemorrhage or mass.  Ventricle size normal. Vascular: Negative for hyperdense vessel Skull: Negative Sinuses/Orbits: Negative Other: None ASPECTS (Alberta Stroke Program Early CT Score) - Ganglionic level infarction (caudate, lentiform nuclei, internal capsule, insula, M1-M3 cortex): 7 - Supraganglionic infarction (M4-M6 cortex): 3 Total score (0-10 with 10 being normal): 10 IMPRESSION: 1. Negative for cerebral hemorrhage. 15 mm hypodensity right frontal white matter which could be artifact or infarct of indeterminate age. 2. ASPECTS is 10 3. Code  stroke imaging results were communicated on 07/02/2021 at 11:53 am to provider [facility] via text page Electronically Signed   By: Marlan Palau M.D.   On: 07/02/2021 12:03   CT ANGIO HEAD NECK W WO CM (CODE  STROKE)  Result Date: 07/02/2021 CLINICAL DATA:  Acute neuro deficit.  Weakness and dizziness. EXAM: CT ANGIOGRAPHY HEAD AND NECK TECHNIQUE: Multidetector CT imaging of the head and neck was performed using the standard protocol during bolus administration of intravenous contrast. Multiplanar CT image reconstructions and MIPs were obtained to evaluate the vascular anatomy. Carotid stenosis measurements (when applicable) are obtained utilizing NASCET criteria, using the distal internal carotid diameter as the denominator. CONTRAST:  87mL OMNIPAQUE IOHEXOL 350 MG/ML SOLN COMPARISON:  CT head 07/02/2021 FINDINGS: CTA NECK FINDINGS Aortic arch: Bovine branching arch. Proximal great vessels widely patent. Right carotid system: Normal right carotid. Negative for atherosclerotic disease or stenosis. Left carotid system: Normal left carotid. Negative for atherosclerotic disease or stenosis Vertebral arteries: Both vertebral arteries are patent to the skull base without stenosis. Right vertebral artery dominant. Skeleton: Multilevel facet degeneration. No acute skeletal abnormality. Other neck: Negative for mass or edema in the neck. Upper chest: Lung apices clear bilaterally. Review of the MIP images confirms the above findings CTA HEAD FINDINGS Anterior circulation: Cavernous carotid widely patent bilaterally. Anterior and middle cerebral arteries widely patent bilaterally. Posterior circulation: Posterior circulation normal. Negative for stenosis or large vessel occlusion. Venous sinuses: Normal venous enhancement. Anatomic variants: None Review of the MIP images confirms the above findings IMPRESSION: Negative CT angiogram head neck. No intracranial extracranial stenosis Negative for intracranial large vessel  occlusion. Electronically Signed   By: Marlan Palau M.D.   On: 07/02/2021 12:08    My personal review of EKG: Rhythm NSR, rate 54 bpm no acute ST changes   Assessment & Plan:      1.  BPV.  MRI of the brain negative, CT angiogram head and neck unremarkable, she does have mild nystagmus and symptoms suggestive of vertigo, will be placed on scheduled meclizine, gradually advance activity, PT OT and monitor.  2.  Hypertension.  Placed on Norvasc and as needed hydralazine.  3.  Dyslipidemia.  Continue home dose statin.  4.  Mildly elevated hCG.  Repeat in the morning.  5.  Mild resting bradycardia.  Monitor on telemetry on no rate controlling agents, check TSH.   DVT Prophylaxis Heparin    AM Labs Ordered, also please review Full Orders  Family Communication: Admission, patients condition and plan of care including tests being ordered have been discussed with the patient and family who indicate understanding and agree with the plan and Code Status.  Code Status Full  Likely DC to  Home  Condition Fair  Consults called: Neuro by ER    Admission status: Obs    Time spent in minutes : 35   Susa Raring M.D on 07/02/2021 at 4:12 PM  To page go to [email or web address] - password [facility]

## 2021-07-02 NOTE — ED Triage Notes (Signed)
While at work patient had sudden onset of dizziness, nausea, generalized weakness, lethargy and double vision. Denies unilateral weakness or sensation abnormalities. A/O x 4. Starting to develop headache as triage goes on.

## 2021-07-02 NOTE — ED Notes (Signed)
Patient was given a Urine cup it's at bedside. Pt. Is aware we need a Urinalysis.

## 2021-07-03 ENCOUNTER — Other Ambulatory Visit (HOSPITAL_COMMUNITY): Payer: Self-pay

## 2021-07-03 DIAGNOSIS — I4891 Unspecified atrial fibrillation: Secondary | ICD-10-CM | POA: Diagnosis not present

## 2021-07-03 DIAGNOSIS — Z79899 Other long term (current) drug therapy: Secondary | ICD-10-CM | POA: Diagnosis not present

## 2021-07-03 DIAGNOSIS — Z7982 Long term (current) use of aspirin: Secondary | ICD-10-CM | POA: Diagnosis not present

## 2021-07-03 DIAGNOSIS — R42 Dizziness and giddiness: Secondary | ICD-10-CM | POA: Diagnosis not present

## 2021-07-03 DIAGNOSIS — Z20822 Contact with and (suspected) exposure to covid-19: Secondary | ICD-10-CM | POA: Diagnosis not present

## 2021-07-03 LAB — COMPREHENSIVE METABOLIC PANEL
ALT: 11 U/L (ref 0–44)
AST: 12 U/L — ABNORMAL LOW (ref 15–41)
Albumin: 3.1 g/dL — ABNORMAL LOW (ref 3.5–5.0)
Alkaline Phosphatase: 62 U/L (ref 38–126)
Anion gap: 6 (ref 5–15)
BUN: 10 mg/dL (ref 6–20)
CO2: 25 mmol/L (ref 22–32)
Calcium: 8.7 mg/dL — ABNORMAL LOW (ref 8.9–10.3)
Chloride: 108 mmol/L (ref 98–111)
Creatinine, Ser: 0.76 mg/dL (ref 0.44–1.00)
GFR, Estimated: >60 mL/min (ref >60)
Glucose, Bld: 87 mg/dL (ref 70–99)
Potassium: 3.6 mmol/L (ref 3.5–5.1)
Sodium: 139 mmol/L (ref 135–145)
Total Bilirubin: 0.6 mg/dL (ref 0.3–1.2)
Total Protein: 5.9 g/dL — ABNORMAL LOW (ref 6.5–8.1)

## 2021-07-03 LAB — CBC WITH DIFFERENTIAL/PLATELET
Abs Immature Granulocytes: 0.02 10*3/uL (ref 0.00–0.07)
Basophils Absolute: 0 10*3/uL (ref 0.0–0.1)
Basophils Relative: 1 %
Eosinophils Absolute: 0.1 10*3/uL (ref 0.0–0.5)
Eosinophils Relative: 1 %
HCT: 35.2 % — ABNORMAL LOW (ref 36.0–46.0)
Hemoglobin: 11 g/dL — ABNORMAL LOW (ref 12.0–15.0)
Immature Granulocytes: 0 %
Lymphocytes Relative: 27 %
Lymphs Abs: 1.6 10*3/uL (ref 0.7–4.0)
MCH: 29.4 pg (ref 26.0–34.0)
MCHC: 31.3 g/dL (ref 30.0–36.0)
MCV: 94.1 fL (ref 80.0–100.0)
Monocytes Absolute: 0.4 10*3/uL (ref 0.1–1.0)
Monocytes Relative: 7 %
Neutro Abs: 3.7 10*3/uL (ref 1.7–7.7)
Neutrophils Relative %: 64 %
Platelets: 242 10*3/uL (ref 150–400)
RBC: 3.74 MIL/uL — ABNORMAL LOW (ref 3.87–5.11)
RDW: 15.3 % (ref 11.5–15.5)
WBC: 5.8 10*3/uL (ref 4.0–10.5)
nRBC: 0 % (ref 0.0–0.2)

## 2021-07-03 LAB — LIPID PANEL
Cholesterol: 151 mg/dL (ref 0–200)
HDL: 40 mg/dL — ABNORMAL LOW (ref >40)
LDL Cholesterol: 89 mg/dL (ref 0–99)
Total CHOL/HDL Ratio: 3.8 RATIO
Triglycerides: 112 mg/dL (ref <150)
VLDL: 22 mg/dL (ref 0–40)

## 2021-07-03 LAB — HCG, QUANTITATIVE, PREGNANCY: hCG, Beta Chain, Quant, S: 2 m[IU]/mL (ref <5)

## 2021-07-03 LAB — MAGNESIUM: Magnesium: 2 mg/dL (ref 1.7–2.4)

## 2021-07-03 MED ORDER — ACETAMINOPHEN 325 MG PO TABS
650.0000 mg | ORAL_TABLET | Freq: Four times a day (QID) | ORAL | 0 refills | Status: DC | PRN
  Filled 2021-07-03: qty 20, 3d supply, fill #0

## 2021-07-03 MED ORDER — MECLIZINE HCL 25 MG PO TABS
25.0000 mg | ORAL_TABLET | Freq: Three times a day (TID) | ORAL | 0 refills | Status: DC | PRN
  Filled 2021-07-03: qty 20, 7d supply, fill #0

## 2021-07-03 MED ORDER — IBUPROFEN 800 MG PO TABS
800.0000 mg | ORAL_TABLET | Freq: Once | ORAL | Status: AC
  Administered 2021-07-03: 800 mg via ORAL
  Filled 2021-07-03: qty 1

## 2021-07-03 NOTE — Evaluation (Signed)
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 MRI negative. Admitted for w/u of possible BPPV. PMH Afib, HTN  Clinical Impression   Patient received on ED stretcher, reports full resolution of her dizziness at this point. Still performed vestibular w/u including orthostatics, all tests negative today and unable to trigger nystagmus this session. She's already been started on meclizine, and it's possible that any remaining nystagmus could be suppressed. Able to mobilize independently without difficulty. Left on ED stretcher with all needs met- would still benefit from OP vestibular f/u if dizziness returns, as based on history of symptoms and negative central tests today sounds very likely to have been a BPPV flare up. Thank you for the opportunity to participate in her care, signing off for now.        Recommendations for follow up therapy are one component of a multi-disciplinary discharge planning process, led by the attending physician.  Recommendations may be updated based on patient status, additional functional criteria and insurance authorization.  Follow Up Recommendations Outpatient PT;Other (comment) (vestibular f/u)    Equipment Recommendations  None recommended by PT    Recommendations for Other Services       Precautions / Restrictions Precautions Precautions: Other (comment) Precaution Comments: new vertigo Restrictions Weight Bearing Restrictions: No      Mobility  Bed Mobility Overal bed mobility: Independent                  Transfers Overall transfer level: Independent Equipment used: None                Ambulation/Gait Ambulation/Gait assistance: Independent Gait Distance (Feet): 30 Feet Assistive device:  None Gait Pattern/deviations: WFL(Within Functional Limits);Step-through pattern Gait velocity: decreased   General Gait Details: no significant deviations noted, steady and stable  Stairs            Wheelchair Mobility    Modified Rankin (Stroke Patients Only)       Balance Overall balance assessment: Independent                                           Pertinent Vitals/Pain Pain Assessment: No/denies pain    Home Living Family/patient expects to be discharged to:: Private residence Living Arrangements: Other (Comment) (boyfriend) Available Help at Discharge: Friend(s);Available 24 hours/day Type of Home: House Home Access: Stairs to enter Entrance Stairs-Rails: Left Entrance Stairs-Number of Steps: garage entry: 4-5 steps with L ascending rail Home Layout: Multi-level;Able to live on main level with bedroom/bathroom Home Equipment: None Additional Comments: 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    Prior Function Level of Independence: Independent               Hand Dominance        Extremity/Trunk Assessment   Upper Extremity Assessment Upper Extremity Assessment: Overall WFL for tasks assessed    Lower Extremity Assessment Lower Extremity Assessment: Overall WFL for tasks assessed    Cervical / Trunk Assessment Cervical / Trunk Assessment: Normal  Communication   Communication: No difficulties  Cognition Arousal/Alertness: Awake/alert Behavior During Therapy: WFL for tasks assessed/performed Overall Cognitive Status: Within Functional Limits for tasks assessed  General Comments      Exercises     Assessment/Plan    PT Assessment All further PT needs can be met in the next venue of care  PT Problem List Decreased balance;Decreased mobility       PT Treatment Interventions Balance training;Gait training;Stair training;Functional mobility training;Therapeutic  activities;Patient/family education    PT Goals (Current goals can be found in the Care Plan section)  Acute Rehab PT Goals Patient Stated Goal: go home PT Goal Formulation: With patient Time For Goal Achievement: 07/17/21 Potential to Achieve Goals: Good    Frequency Other (Comment) (Eval only)   Barriers to discharge        Co-evaluation               AM-PAC PT "6 Clicks" Mobility  Outcome Measure Help needed turning from your back to your side while in a flat bed without using bedrails?: None Help needed moving from lying on your back to sitting on the side of a flat bed without using bedrails?: None Help needed moving to and from a bed to a chair (including a wheelchair)?: None Help needed standing up from a chair using your arms (e.g., wheelchair or bedside chair)?: None Help needed to walk in hospital room?: None Help needed climbing 3-5 steps with a railing? : None 6 Click Score: 24    End of Session   Activity Tolerance: Patient tolerated treatment well Patient left: in bed;with call bell/phone within reach (ED stretcher) Nurse Communication: Mobility status PT Visit Diagnosis: Dizziness and giddiness (R42)    Time: 3888-7579 PT Time Calculation (min) (ACUTE ONLY): 25 min   Charges:   PT Evaluation $PT Eval Low Complexity: 1 Low PT Treatments $Neuromuscular Re-education: 8-22 mins       Windell Norfolk, DPT, PN2   Supplemental Physical Therapist [place]    Pager [phone] Acute Rehab Office [phone]

## 2021-07-03 NOTE — ED Notes (Signed)
Admitting paged to RN per his request  

## 2021-07-03 NOTE — Plan of Care (Signed)
                                      [facility]                            [address] [place], [place] [address]      Elizabeth [name] was admitted to the Hospital on 07/02/2021 and Discharged  07/03/2021 and should be excused from work/school   for 4 days starting from date -  07/02/2021 , may return to work/school without any restrictions.  Call Susa Raring MD, [facility]  [phone] with questions.  Susa Raring M.D on 07/03/2021,at 11:47 AM  [facility]   Office  [phone]

## 2021-07-03 NOTE — Discharge Instructions (Signed)
Follow with Primary MD Blair Heys, MD in 7 days   Get CBC, CMP, Magnesium, TSH -  checked next visit within 1 week by Primary MD    Activity: As tolerated with Full fall precautions use walker/cane & assistance as needed  Disposition Home    Diet: Heart Healthy    Special Instructions: If you have smoked or chewed Tobacco  in the last 2 yrs please stop smoking, stop any regular Alcohol  and or any Recreational drug use.  On your next visit with your primary care physician please Get Medicines reviewed and adjusted.  Please request your Prim.MD to go over all Hospital Tests and Procedure/Radiological results at the follow up, please get all Hospital records sent to your Prim MD by signing hospital release before you go home.  If you experience worsening of your admission symptoms, develop shortness of breath, life threatening emergency, suicidal or homicidal thoughts you must seek medical attention immediately by calling 911 or calling your MD immediately  if symptoms less severe.  You Must read complete instructions/literature along with all the possible adverse reactions/side effects for all the Medicines you take and that have been prescribed to you. Take any new Medicines after you have completely understood and accpet all the possible adverse reactions/side effects.

## 2021-07-03 NOTE — Discharge Summary (Signed)
Elizabeth [name] ZOX:[id number] DOB: [birth year] DOA: 07/02/2021  PCP: Blair Heys, MD  Admit date: 07/02/2021  Discharge date: 07/03/2021  Admitted From: Home  Disposition:  Home   Recommendations for Outpatient Follow-up:   Follow up with PCP in 1-2 weeks  PCP Please obtain BMP/CBC, 2 view CXR in 1week,  (see Discharge instructions)   PCP Please follow up on the following pending results: Lipid panel, A1c, TSH along with CBC CMP in 7 to 10 days.  If vertigo recurs outpt ENT follow-up.   Home Health: None   Equipment/Devices: None  Consultations: Neuro Discharge Condition: Stable    CODE STATUS: Full    Diet Recommendation: Heart Healthy   Diet Order             Diet - low sodium heart healthy           Diet Heart Room service appropriate? Yes; Fluid consistency: Thin  Diet effective now                    Chief Complaint  Patient presents with   Dizziness     Brief history of present illness from the day of admission and additional interim summary     Elizabeth [name]  is a 60 y.o. 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  BPV.  MRI of the brain negative, CT angiogram head and neck unremarkable, she did have mild nystagmus and symptoms suggestive of vertigo, was placed on scheduled meclizine, now symptom free, nystagmus, vertigo and nausea resolved, DC on  PRN Meclizine with PCP follow up.   2.  Hypertension.  Placed on Norvasc and as needed hydralazine.   3.  Dyslipidemia.  Continue home dose statin.   4.  Mildly elevated hCG.  Repeat -ve .   5.  Mild resting bradycardia.  Monitor on telemetry on no rate controlling agents, PCP to check TSH.  Discharge diagnosis     Active Problems:   Vertigo    Discharge instructions    Discharge Instructions     Diet - low sodium heart healthy   Complete by: As directed    Discharge instructions   Complete by: As directed    Follow with Primary MD Blair Heys, MD in 7 days  Get CBC, CMP, Magnesium, TSH -  checked next visit within 1 week by Primary MD    Activity: As tolerated with Full fall precautions use walker/cane & assistance as needed  Disposition Home    Diet: Heart Healthy    Special Instructions: If you have smoked or chewed Tobacco  in the last 2 yrs please stop smoking, stop any regular Alcohol  and or any Recreational drug use.  On your next visit with your primary care physician please Get Medicines reviewed and adjusted.  Please request your Prim.MD to go over all Hospital Tests and Procedure/Radiological results at the follow up, please get all Hospital records sent to your Prim MD by signing hospital release before you go home.  If you experience worsening of your admission symptoms, develop shortness of breath, life threatening emergency, suicidal or homicidal thoughts you must seek medical attention immediately by calling 911 or calling your MD immediately  if symptoms less severe.  You Must read complete instructions/literature along with all the possible adverse reactions/side effects for all the Medicines you take and that have been prescribed to you. Take any new Medicines after you have completely understood and accpet all the possible adverse reactions/side effects.   Increase activity slowly   Complete by: As directed        Discharge Medications    Allergies as of 07/03/2021   No Known Allergies      Medication List     TAKE these medications    acetaminophen 325 MG tablet Commonly known as: TYLENOL Take 2 tablets (650 mg total) by mouth every 6 (six) hours as needed for mild pain or headache (or Fever >/= 101).   aspirin EC 81 MG tablet Take 81 mg by mouth daily.   atorvastatin 10 MG tablet Commonly known as: LIPITOR Take 1 tablet (10 mg total) by mouth daily for cholesterol What changed: Another medication with the same name was removed. Continue taking this medication, and follow the directions you see here.   doxepin 10 MG capsule Commonly known as: SINEQUAN Take 2 capsules (20 mg total) by mouth daily before bed as needed for sleep   estradiol 0.5 MG tablet Commonly known as: ESTRACE Take 1 tablet (0.5 mg total) by mouth daily.   lisinopril-hydrochlorothiazide 20-12.5 MG tablet Commonly known as: ZESTORETIC Take 1 tablet by mouth daily for blood pressure What changed: Another medication with the same name was removed. Continue taking this medication, and follow the directions you see here.   meclizine 25 MG tablet Commonly known as: ANTIVERT Take 1 tablet (25 mg total) by mouth 3 (three) times daily as needed for dizziness.   progesterone 100 MG capsule Commonly known as: PROMETRIUM Take 1 capsule (100 mg total) by mouth at bedtime.         Follow-up Information     Blair Heys, MD. Schedule an appointment as soon as possible for a visit in 1 week(s).   Specialty: Family Medicine Contact information: [address], [address] [place] [address] [phone]                 Major procedures and Radiology Reports - PLEASE review detailed and final reports thoroughly  -       MR BRAIN WO CONTRAST  Result Date: 07/02/2021 CLINICAL DATA:  Transient ischemic attack (TIA) Abrupt onset vertigo, diplopia, evaluate for posterior infarct EXAM: MRI HEAD WITHOUT CONTRAST TECHNIQUE:  Multiplanar, multiecho pulse sequences of the brain and surrounding structures were obtained without  intravenous contrast. COMPARISON:  None. FINDINGS: Brain: There is no acute infarction or intracranial hemorrhage. There is no intracranial mass, mass effect, or edema. There is no hydrocephalus or extra-axial fluid collection. Ventricles and sulci are normal in size and configuration. Patchy foci of T2 hyperintensity in the supratentorial white matter are nonspecific but may reflect mild chronic microvascular ischemic changes. Vascular: Major vessel flow voids at the skull base are preserved. Skull and upper cervical spine: Normal marrow signal is preserved. Sinuses/Orbits: Paranasal sinuses are aerated. Orbits are unremarkable. Other: Sella is unremarkable.  Mastoid air cells are clear. IMPRESSION: No acute infarction, hemorrhage, or mass. Mild probable chronic microvascular ischemic changes. Electronically Signed   By: Guadlupe Spanish M.D.   On: 07/02/2021 15:15   CT HEAD CODE STROKE WO CONTRAST  Result Date: 07/02/2021 CLINICAL DATA:  Code stroke. Acute neuro deficit. Dizziness and weakness EXAM: CT HEAD WITHOUT CONTRAST TECHNIQUE: Contiguous axial images were obtained from the base of the skull through the vertex without intravenous contrast. COMPARISON:  CT head 03/05/2013 FINDINGS: Brain: Proximally 15 mm hypodensity right frontal white matter best seen on axial image 21. This is not seen previously. This could be artifact or possibly acute or chronic ischemia. Negative for acute hemorrhage or mass.  Ventricle size normal. Vascular: Negative for hyperdense vessel Skull: Negative Sinuses/Orbits: Negative Other: None ASPECTS (Alberta Stroke Program Early CT Score) - Ganglionic level infarction (caudate, lentiform nuclei, internal capsule, insula, M1-M3 cortex): 7 - Supraganglionic infarction (M4-M6 cortex): 3 Total score (0-10 with 10 being normal): 10 IMPRESSION: 1. Negative for cerebral hemorrhage. 15 mm  hypodensity right frontal white matter which could be artifact or infarct of indeterminate age. 2. ASPECTS is 10 3. Code stroke imaging results were communicated on 07/02/2021 at 11:53 am to provider [facility] via text page Electronically Signed   By: Marlan Palau M.D.   On: 07/02/2021 12:03   CT ANGIO HEAD NECK W WO CM (CODE STROKE)  Result Date: 07/02/2021 CLINICAL DATA:  Acute neuro deficit.  Weakness and dizziness. EXAM: CT ANGIOGRAPHY HEAD AND NECK TECHNIQUE: Multidetector CT imaging of the head and neck was performed using the standard protocol during bolus administration of intravenous contrast. Multiplanar CT image reconstructions and MIPs were obtained to evaluate the vascular anatomy. Carotid stenosis measurements (when applicable) are obtained utilizing NASCET criteria, using the distal internal carotid diameter as the denominator. CONTRAST:  95mL OMNIPAQUE IOHEXOL 350 MG/ML SOLN COMPARISON:  CT head 07/02/2021 FINDINGS: CTA NECK FINDINGS Aortic arch: Bovine branching arch. Proximal great vessels widely patent. Right carotid system: Normal right carotid. Negative for atherosclerotic disease or stenosis. Left carotid system: Normal left carotid. Negative for atherosclerotic disease or stenosis Vertebral arteries: Both vertebral arteries are patent to the skull base without stenosis. Right vertebral artery dominant. Skeleton: Multilevel facet degeneration. No acute skeletal abnormality. Other neck: Negative for mass or edema in the neck. Upper chest: Lung apices clear bilaterally. Review of the MIP images confirms the above findings CTA HEAD FINDINGS Anterior circulation: Cavernous carotid widely patent bilaterally. Anterior and middle cerebral arteries widely patent bilaterally. Posterior circulation: Posterior circulation normal. Negative for stenosis or large vessel occlusion. Venous sinuses: Normal venous enhancement. Anatomic variants: None Review of the MIP images confirms the above findings  IMPRESSION: Negative CT angiogram head neck. No intracranial extracranial stenosis Negative for intracranial large vessel occlusion. Electronically Signed   By: Marlan Palau M.D.   On: 07/02/2021 12:08      Today   Subjective    Elizabeth [name]  today has no headache,no chest abdominal pain,no new weakness tingling or numbness, feels much better wants to go home today.     Objective   Blood pressure (!) 151/75, pulse (!) 55, temperature 98.4 F (36.9 C), temperature source Oral, resp. rate 13, height 5\' 5"  (1.651 m), weight 104.3 kg, last menstrual period 10/02/2013, SpO2 97 %.   Intake/Output Summary (Last 24 hours) at 07/03/2021 1151 Last data filed at 07/03/2021 0926 Gross per 24 hour  Intake --  Output 325 ml  Net -325 ml    Exam  Awake Alert, No new F.N deficits, Normal affect Graves.AT,PERRAL Supple Neck,No JVD, No cervical lymphadenopathy appriciated.  Symmetrical Chest wall movement, Good air movement bilaterally, CTAB RRR,No Gallops,Rubs or new Murmurs, No Parasternal Heave +ve B.Sounds, Abd Soft, Non tender, No organomegaly appriciated, No rebound -guarding or rigidity. No Cyanosis, Clubbing or edema, No new Rash or bruise   Data Review   CBC w Diff:  Lab Results  Component Value Date   WBC 5.8 07/03/2021   HGB 11.0 (L) 07/03/2021   HCT 35.2 (L) 07/03/2021   PLT 242 07/03/2021   LYMPHOPCT 27 07/03/2021   MONOPCT 7 07/03/2021   EOSPCT 1 07/03/2021   BASOPCT 1 07/03/2021    CMP:  Lab Results  Component Value Date   NA 139 07/03/2021   K 3.6 07/03/2021   CL 108 07/03/2021   CO2 25 07/03/2021   BUN 10 07/03/2021   CREATININE 0.76 07/03/2021   PROT 5.9 (L) 07/03/2021   ALBUMIN 3.1 (L) 07/03/2021   BILITOT 0.6 07/03/2021   ALKPHOS 62 07/03/2021   AST 12 (L) 07/03/2021   ALT 11 07/03/2021  .   Total Time in preparing paper work, data evaluation and todays exam - 35 minutes  07/05/2021 M.D on 07/03/2021 at 11:51 AM  [facility]

## 2021-07-03 NOTE — Progress Notes (Signed)
PT eval complete/documentation pending. Would benefit from OP PT vestibular f/u. No DME needed.  Madelaine Etienne, DPT, PN2   Supplemental Physical Therapist [facility]    Pager [phone] Acute Rehab Office [phone]

## 2021-07-03 NOTE — ED Notes (Signed)
Hospital bed ordered for patient.

## 2021-07-03 NOTE — ED Notes (Signed)
Pt endorses headache and dizziness. MD aware. Modified NIH 0.

## 2021-07-03 NOTE — Progress Notes (Signed)
OT Cancellation Note  Patient Details Name: Elizabeth [name] MRN: [id number] DOB: 07-07-1961   Cancelled Treatment:    Reason Eval/Treat Not Completed: OT screened, no needs identified, will sign off.  Pt reports she feels she is close to her baseline, and feels she can manage at home independently.   [name]., OTR/L Acute Rehabilitation Services Pager [phone] Office [phone]   [name] 07/03/2021, 12:45 PM

## 2021-07-03 NOTE — Progress Notes (Signed)
PT Cancellation Note  Patient Details Name: Elizabeth [name] MRN: [id number] DOB: May 27, 1961   Cancelled Treatment:    Reason Eval/Treat Not Completed: Other (comment) just got lunch and would like for PT to come back. Will attempt if time/schedule allow.   Madelaine Etienne, DPT, PN2   Supplemental Physical Therapist [facility]    Pager [phone] Acute Rehab Office [phone]

## 2021-07-03 NOTE — ED Notes (Signed)
V.o.v for ibuprofen per Dr Thedore Mins for HA. Regular hospital bed ordered. Pt resting sleeping, NAD, calm, VSS.

## 2021-07-16 ENCOUNTER — Other Ambulatory Visit (HOSPITAL_COMMUNITY): Payer: Self-pay

## 2021-07-21 DIAGNOSIS — R42 Dizziness and giddiness: Secondary | ICD-10-CM | POA: Diagnosis not present

## 2021-07-21 DIAGNOSIS — M1990 Unspecified osteoarthritis, unspecified site: Secondary | ICD-10-CM | POA: Diagnosis not present

## 2021-07-22 ENCOUNTER — Other Ambulatory Visit (HOSPITAL_COMMUNITY): Payer: Self-pay | Admitting: Physician Assistant

## 2021-07-22 ENCOUNTER — Other Ambulatory Visit (HOSPITAL_COMMUNITY): Payer: Self-pay

## 2021-07-23 ENCOUNTER — Other Ambulatory Visit (HOSPITAL_COMMUNITY): Payer: Self-pay

## 2021-07-24 ENCOUNTER — Other Ambulatory Visit (HOSPITAL_COMMUNITY): Payer: Self-pay

## 2021-07-24 MED ORDER — DICLOFENAC SODIUM 75 MG PO TBEC
75.0000 mg | DELAYED_RELEASE_TABLET | Freq: Two times a day (BID) | ORAL | 1 refills | Status: DC
  Filled 2021-07-24: qty 60, 30d supply, fill #0
  Filled 2021-09-17: qty 60, 30d supply, fill #1

## 2021-07-27 ENCOUNTER — Other Ambulatory Visit (HOSPITAL_COMMUNITY): Payer: Self-pay

## 2021-09-17 ENCOUNTER — Other Ambulatory Visit (HOSPITAL_COMMUNITY): Payer: Self-pay

## 2021-09-23 ENCOUNTER — Other Ambulatory Visit (HOSPITAL_COMMUNITY): Payer: Self-pay

## 2021-10-20 ENCOUNTER — Other Ambulatory Visit (HOSPITAL_COMMUNITY): Payer: Self-pay

## 2021-12-07 ENCOUNTER — Other Ambulatory Visit (HOSPITAL_COMMUNITY): Payer: Self-pay

## 2021-12-07 MED ORDER — DICLOFENAC SODIUM 75 MG PO TBEC
75.0000 mg | DELAYED_RELEASE_TABLET | Freq: Two times a day (BID) | ORAL | 1 refills | Status: DC
  Filled 2021-12-07: qty 60, 30d supply, fill #0

## 2021-12-08 ENCOUNTER — Other Ambulatory Visit (HOSPITAL_COMMUNITY): Payer: Self-pay

## 2021-12-15 DIAGNOSIS — M25572 Pain in left ankle and joints of left foot: Secondary | ICD-10-CM | POA: Diagnosis not present

## 2021-12-28 DIAGNOSIS — M25572 Pain in left ankle and joints of left foot: Secondary | ICD-10-CM | POA: Diagnosis not present

## 2021-12-29 ENCOUNTER — Other Ambulatory Visit (HOSPITAL_COMMUNITY): Payer: Self-pay

## 2021-12-31 ENCOUNTER — Other Ambulatory Visit (HOSPITAL_COMMUNITY): Payer: Self-pay

## 2022-01-01 ENCOUNTER — Other Ambulatory Visit (HOSPITAL_COMMUNITY): Payer: Self-pay

## 2022-01-01 DIAGNOSIS — K921 Melena: Secondary | ICD-10-CM | POA: Diagnosis not present

## 2022-01-01 DIAGNOSIS — R109 Unspecified abdominal pain: Secondary | ICD-10-CM | POA: Diagnosis not present

## 2022-01-01 DIAGNOSIS — R195 Other fecal abnormalities: Secondary | ICD-10-CM | POA: Diagnosis not present

## 2022-01-01 MED ORDER — HYOSCYAMINE SULFATE 0.125 MG PO TABS
0.1250 mg | ORAL_TABLET | Freq: Three times a day (TID) | ORAL | 0 refills | Status: DC | PRN
  Filled 2022-01-01: qty 30, 10d supply, fill #0

## 2022-01-05 DIAGNOSIS — Z1231 Encounter for screening mammogram for malignant neoplasm of breast: Secondary | ICD-10-CM | POA: Diagnosis not present

## 2022-01-08 ENCOUNTER — Other Ambulatory Visit (HOSPITAL_COMMUNITY): Payer: Self-pay

## 2022-01-08 DIAGNOSIS — I10 Essential (primary) hypertension: Secondary | ICD-10-CM | POA: Diagnosis not present

## 2022-01-08 DIAGNOSIS — U071 COVID-19: Secondary | ICD-10-CM | POA: Diagnosis not present

## 2022-01-08 DIAGNOSIS — E785 Hyperlipidemia, unspecified: Secondary | ICD-10-CM | POA: Insufficient documentation

## 2022-01-08 DIAGNOSIS — M1990 Unspecified osteoarthritis, unspecified site: Secondary | ICD-10-CM | POA: Insufficient documentation

## 2022-01-08 MED ORDER — PAXLOVID (300/100) 20 X 150 MG & 10 X 100MG PO TBPK
ORAL_TABLET | ORAL | 0 refills | Status: DC
Start: 2022-01-08 — End: 2022-04-23
  Filled 2022-01-08: qty 30, 5d supply, fill #0

## 2022-01-11 ENCOUNTER — Other Ambulatory Visit (HOSPITAL_COMMUNITY): Payer: Self-pay

## 2022-01-13 ENCOUNTER — Encounter: Payer: Self-pay | Admitting: Obstetrics & Gynecology

## 2022-01-18 ENCOUNTER — Other Ambulatory Visit (HOSPITAL_COMMUNITY): Payer: Self-pay

## 2022-01-26 ENCOUNTER — Other Ambulatory Visit (HOSPITAL_COMMUNITY): Payer: Self-pay

## 2022-01-26 MED ORDER — HYDROCODONE BIT-HOMATROP MBR 5-1.5 MG/5ML PO SOLN
5.0000 mL | ORAL | 0 refills | Status: DC | PRN
Start: 2022-01-26 — End: 2022-04-23
  Filled 2022-01-26: qty 120, 4d supply, fill #0

## 2022-02-10 ENCOUNTER — Other Ambulatory Visit (HOSPITAL_COMMUNITY): Payer: Self-pay

## 2022-03-08 ENCOUNTER — Other Ambulatory Visit (HOSPITAL_COMMUNITY): Payer: Self-pay

## 2022-03-08 DIAGNOSIS — I10 Essential (primary) hypertension: Secondary | ICD-10-CM | POA: Diagnosis not present

## 2022-03-08 DIAGNOSIS — E7800 Pure hypercholesterolemia, unspecified: Secondary | ICD-10-CM | POA: Diagnosis not present

## 2022-03-08 DIAGNOSIS — G479 Sleep disorder, unspecified: Secondary | ICD-10-CM | POA: Diagnosis not present

## 2022-03-08 DIAGNOSIS — R7309 Other abnormal glucose: Secondary | ICD-10-CM | POA: Diagnosis not present

## 2022-03-08 DIAGNOSIS — M1990 Unspecified osteoarthritis, unspecified site: Secondary | ICD-10-CM | POA: Diagnosis not present

## 2022-03-08 MED ORDER — DOXEPIN HCL 10 MG PO CAPS
20.0000 mg | ORAL_CAPSULE | Freq: Every evening | ORAL | 3 refills | Status: DC | PRN
  Filled 2022-03-08 – 2022-09-02 (×2): qty 180, 90d supply, fill #0
  Filled 2022-11-10 – 2022-11-12 (×2): qty 180, 90d supply, fill #1
  Filled 2023-02-09: qty 180, 90d supply, fill #2

## 2022-03-08 MED ORDER — ATORVASTATIN CALCIUM 10 MG PO TABS
10.0000 mg | ORAL_TABLET | Freq: Every day | ORAL | 3 refills | Status: DC
  Filled 2022-03-08 – 2022-03-24 (×2): qty 90, 90d supply, fill #0
  Filled 2022-07-21: qty 90, 90d supply, fill #1
  Filled 2022-11-10: qty 90, 90d supply, fill #2

## 2022-03-08 MED ORDER — LISINOPRIL-HYDROCHLOROTHIAZIDE 20-12.5 MG PO TABS
1.0000 | ORAL_TABLET | Freq: Every day | ORAL | 3 refills | Status: DC
  Filled 2022-03-08 – 2022-03-25 (×2): qty 90, 90d supply, fill #0
  Filled 2022-06-22: qty 90, 90d supply, fill #1
  Filled 2022-09-20: qty 90, 90d supply, fill #2
  Filled 2022-12-24: qty 90, 90d supply, fill #3

## 2022-03-24 ENCOUNTER — Other Ambulatory Visit (HOSPITAL_COMMUNITY): Payer: Self-pay

## 2022-03-25 ENCOUNTER — Other Ambulatory Visit (HOSPITAL_COMMUNITY): Payer: Self-pay

## 2022-04-05 ENCOUNTER — Other Ambulatory Visit: Payer: Self-pay | Admitting: Obstetrics & Gynecology

## 2022-04-05 ENCOUNTER — Other Ambulatory Visit (HOSPITAL_COMMUNITY): Payer: Self-pay

## 2022-04-05 MED ORDER — ESTRADIOL 0.5 MG PO TABS
0.5000 mg | ORAL_TABLET | Freq: Every day | ORAL | 0 refills | Status: DC
  Filled 2022-04-05: qty 30, 30d supply, fill #0

## 2022-04-05 NOTE — Telephone Encounter (Signed)
Annual exam scheduled on 04/23/22 Last annual exam was 02/27/2021

## 2022-04-20 ENCOUNTER — Other Ambulatory Visit: Payer: Self-pay | Admitting: Obstetrics & Gynecology

## 2022-04-20 ENCOUNTER — Other Ambulatory Visit (HOSPITAL_COMMUNITY): Payer: Self-pay

## 2022-04-20 MED ORDER — PROGESTERONE MICRONIZED 100 MG PO CAPS
100.0000 mg | ORAL_CAPSULE | Freq: Every day | ORAL | 0 refills | Status: DC
  Filled 2022-04-20: qty 90, 90d supply, fill #0

## 2022-04-20 NOTE — Telephone Encounter (Signed)
Last annual exam 02/2021 Scheduled on 04/23/22 Rx sent

## 2022-04-23 ENCOUNTER — Other Ambulatory Visit (HOSPITAL_COMMUNITY): Payer: Self-pay

## 2022-04-23 ENCOUNTER — Encounter: Payer: Self-pay | Admitting: Obstetrics & Gynecology

## 2022-04-23 ENCOUNTER — Ambulatory Visit (INDEPENDENT_AMBULATORY_CARE_PROVIDER_SITE_OTHER): Payer: 59 | Admitting: Obstetrics & Gynecology

## 2022-04-23 VITALS — BP 120/76 | HR 71 | Ht 64.25 in | Wt 226.0 lb

## 2022-04-23 DIAGNOSIS — Z01419 Encounter for gynecological examination (general) (routine) without abnormal findings: Secondary | ICD-10-CM | POA: Diagnosis not present

## 2022-04-23 DIAGNOSIS — Z6838 Body mass index (BMI) 38.0-38.9, adult: Secondary | ICD-10-CM | POA: Diagnosis not present

## 2022-04-23 DIAGNOSIS — Z79890 Hormone replacement therapy (postmenopausal): Secondary | ICD-10-CM

## 2022-04-23 LAB — HM PAP SMEAR: HM Pap smear: NORMAL

## 2022-04-23 MED ORDER — ESTRADIOL 0.5 MG PO TABS
0.5000 mg | ORAL_TABLET | Freq: Every day | ORAL | 4 refills | Status: DC
  Filled 2022-04-23 – 2022-05-11 (×2): qty 90, 90d supply, fill #0
  Filled 2022-07-21: qty 90, 90d supply, fill #1
  Filled 2022-11-10: qty 90, 90d supply, fill #2
  Filled 2023-02-09: qty 90, 90d supply, fill #3

## 2022-04-23 MED ORDER — PROGESTERONE MICRONIZED 100 MG PO CAPS
100.0000 mg | ORAL_CAPSULE | Freq: Every day | ORAL | 4 refills | Status: DC
Start: 2022-04-23 — End: 2022-07-22
  Filled 2022-04-23 – 2022-07-21 (×2): qty 90, 90d supply, fill #0

## 2022-04-23 NOTE — Progress Notes (Signed)
Elizabeth [name] 05-Feb-1961 [id number]   History:    61 y.o. [id number] Long term boyfriend >20 yrs.  6 grand-children.   RP:  Established patient presenting for annual gyn exam    HPI: Postmenopause, well on HRT with Estradiol 0.5 mg tab PO daily and Prometrium 100 mg PO HS.  No PMB.  No pelvic pain.  Abstinent.  No h/o abnormal Pap.  Pap Neg 10/2019.  Will repeat Pap at 4 years.  Breasts normal. Mammo Neg 12/2021.  Urine/BMs normal.  BMI 38.49.  Lower calorie/carb.  Increase fitness activities.  Colono 03/2014. Health labs with Fam MD.    Past medical history,surgical history, family history and social history were all reviewed and documented in the EPIC chart.  Gynecologic History Patient's last menstrual period was 10/02/2013.  Obstetric History OB History  Gravida Para Term Preterm AB Living  4 3 3   1 3   SAB IAB Ectopic Multiple Live Births  1            # Outcome Date GA Lbr Len/2nd Weight Sex Delivery Anes PTL Lv  4 SAB           3 Term           2 Term           1 Term              ROS: A ROS was performed and pertinent positives and negatives are included in the history. GENERAL: No fevers or chills. HEENT: No change in vision, no earache, sore throat or sinus congestion. NECK: No pain or stiffness. CARDIOVASCULAR: No chest pain or pressure. No palpitations. PULMONARY: No shortness of breath, cough or wheeze. GASTROINTESTINAL: No abdominal pain, nausea, vomiting or diarrhea, melena or bright red blood per rectum. GENITOURINARY: No urinary frequency, urgency, hesitancy or dysuria. MUSCULOSKELETAL: No joint or muscle pain, no back pain, no recent trauma. DERMATOLOGIC: No rash, no itching, no lesions. ENDOCRINE: No polyuria, polydipsia, no heat or cold intolerance. No recent change in weight. HEMATOLOGICAL: No anemia or easy bruising or bleeding. NEUROLOGIC: No headache, seizures, numbness, tingling or weakness. PSYCHIATRIC: No depression, no loss of interest in normal activity or  change in sleep pattern.     Exam:   BP 120/76   Pulse 71   Ht 5' 4.25" (1.632 m)   Wt 226 lb (102.5 kg)   LMP 10/02/2013 Comment: BTL  SpO2 98%   BMI 38.49 kg/m   Body mass index is 38.49 kg/m.  General appearance : Well developed well nourished female. No acute distress HEENT: Eyes: no retinal hemorrhage or exudates,  Neck supple, trachea midline, no carotid bruits, no thyroidmegaly Lungs: Clear to auscultation, no rhonchi or wheezes, or rib retractions  Heart: Regular rate and rhythm, no murmurs or gallops Breast:Examined in sitting and supine position were symmetrical in appearance, no palpable masses or tenderness,  no skin retraction, no nipple inversion, no nipple discharge, no skin discoloration, no axillary or supraclavicular lymphadenopathy Abdomen: no palpable masses or tenderness, no rebound or guarding Extremities: no edema or skin discoloration or tenderness  Pelvic: Vulva: Normal             Vagina: No gross lesions or discharge  Cervix: No gross lesions or discharge  Uterus  AV, normal size, shape and consistency, non-tender and mobile  Adnexa  Without masses or tenderness  Anus: Normal   Assessment/Plan:  61 y.o. female for annual exam   1. Well female exam  with routine gynecological exam Postmenopause, well on HRT with Estradiol 0.5 mg tab PO daily and Prometrium 100 mg PO HS.  No PMB.  No pelvic pain.  Abstinent.  No h/o abnormal Pap.  Pap Neg 10/2019.  Will repeat Pap at 3 years.  Breasts normal. Mammo Neg 12/2021.  Urine/BMs normal.  BMI 38.49.  Lower calorie/carb.  Increase fitness activities.  Colono 03/2014. Health labs with Fam MD.  2. Postmenopausal hormone replacement therapy Postmenopause, well on HRT with Estradiol 0.5 mg tab PO daily and Prometrium 100 mg PO HS.  No PMB.  No pelvic pain.  Abstinent. No CI to HRT.  Prescription sent to pharmacy.  Vit D, Ca++ 1.5 g/d total, weight bearing physical activities.  3. Class 2 severe obesity due to excess  calories with serious comorbidity and body mass index (BMI) of 38.0 to 38.9 in adult (HCC) BMI 38.49.  Lower calorie/carb.  Increase fitness activities.  Other orders - estradiol (ESTRACE) 0.5 MG tablet; Take 1 tablet (0.5 mg total) by mouth daily. - progesterone (PROMETRIUM) 100 MG capsule; Take 1 capsule (100 mg total) by mouth at bedtime.   Genia Del MD, 11:36 AM 04/23/2022

## 2022-05-11 ENCOUNTER — Other Ambulatory Visit (HOSPITAL_COMMUNITY): Payer: Self-pay

## 2022-06-08 IMAGING — MR MR HEAD W/O CM
6 of 11 series · 24 of 48 positions shown · non-contrast
Comparison: None.

CLINICAL DATA: Transient ischemic attack (TIA) Abrupt onset
vertigo, diplopia, evaluate for posterior infarct

EXAM:
MRI HEAD WITHOUT CONTRAST
TECHNIQUE: Multiplanar, multiecho pulse sequences of the brain and surrounding
structures were obtained without intravenous contrast.

[Series 3: DWI · axial · 3.0mm · 0.94mm/px · z∈[-85,+62]mm · 7 of 104 slices shown (1 of 2)]
[im 1/104]
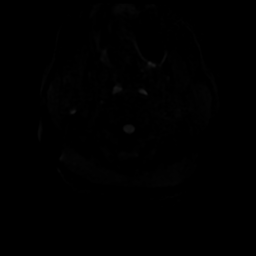
[im 18/104]
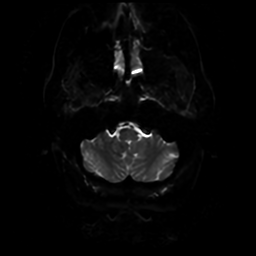
[im 35/104]
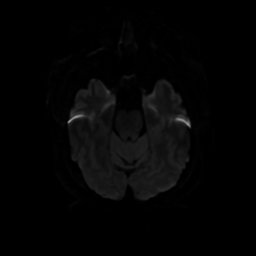
[im 52/104]
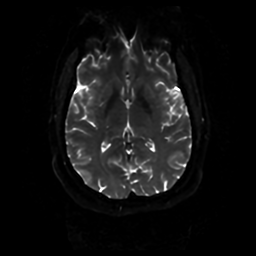
[im 69/104]
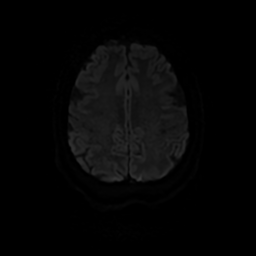
[im 86/104]
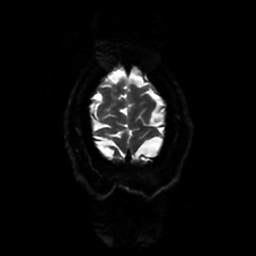
[im 104/104]
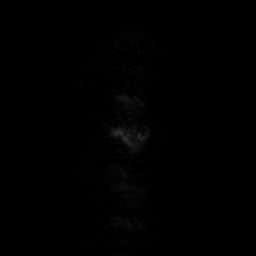

[Series 4: DWI · coronal · 4.0mm · 0.94mm/px · 5 of 74 slices shown (2 of 2)]
[im 1/74]
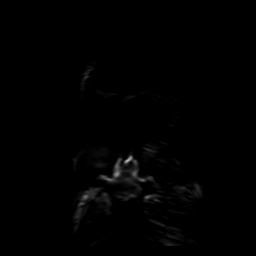
[im 19/74]
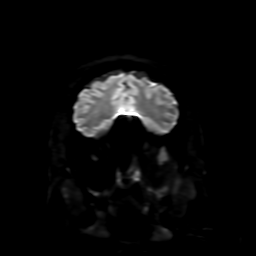
[im 37/74]
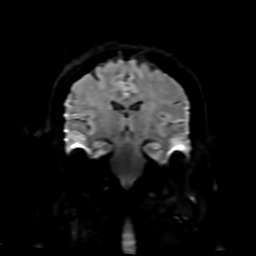
[im 55/74]
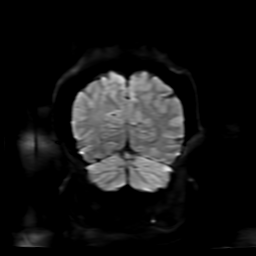
[im 74/74]
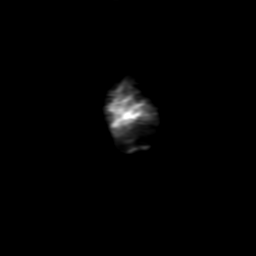

[Series 5: FLAIR · sagittal · 5.0mm · 0.23mm/px · 2 of 26 slices shown (1 of 2)]
[im 1/26]
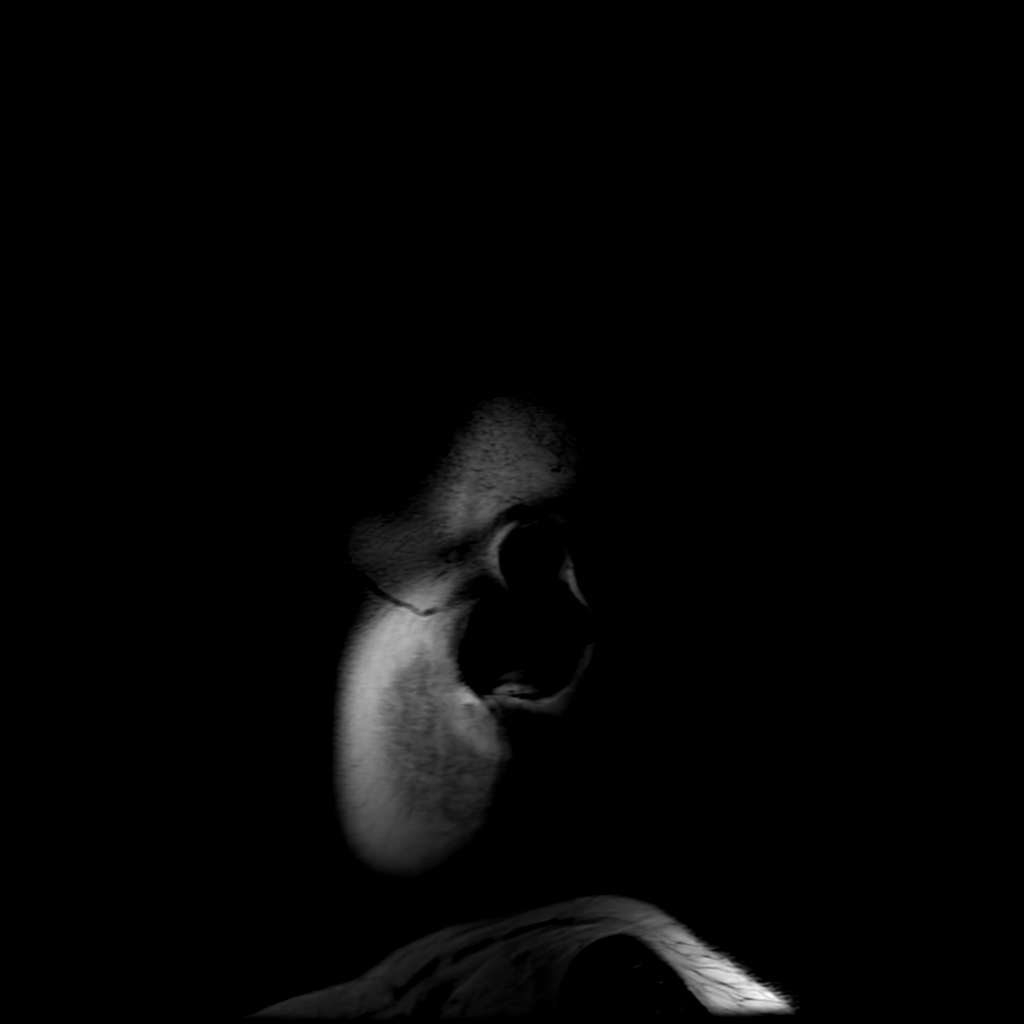
[im 26/26]
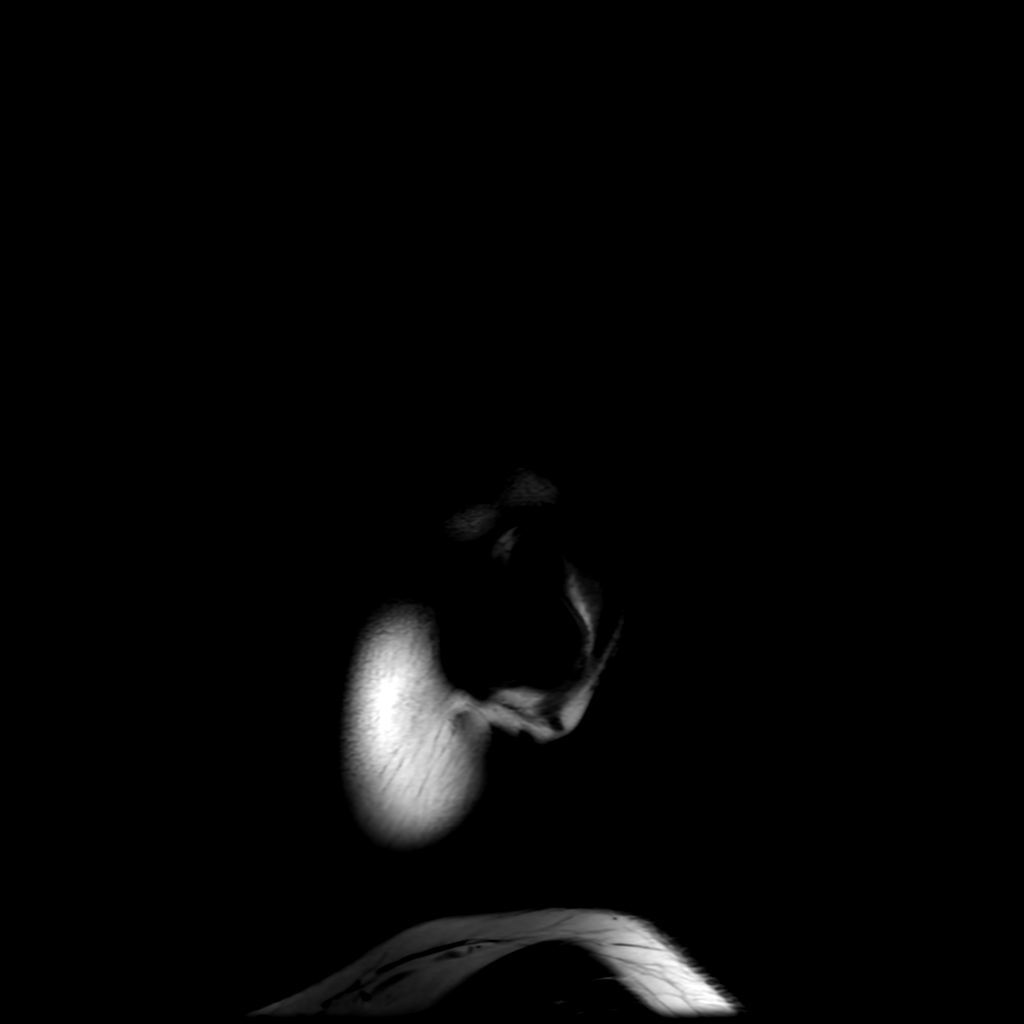

[Series 7: FLAIR · axial · 4.0mm · 0.45mm/px · z∈[-86,+57]mm · 3 of 35 slices shown (2 of 2)]
[im 1/35]
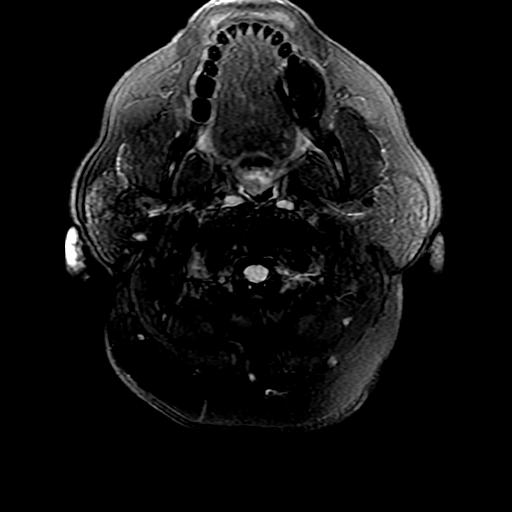
[im 18/35]
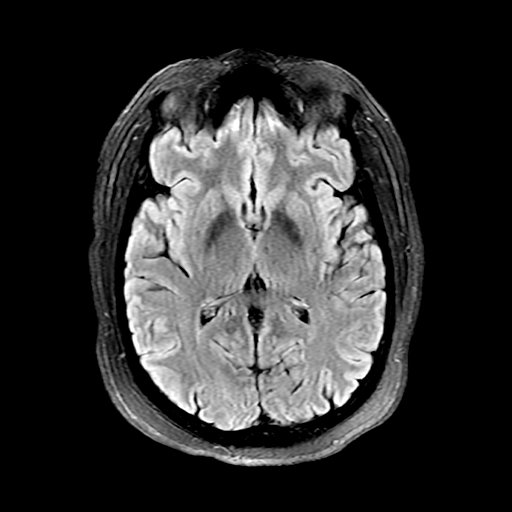
[im 35/35]
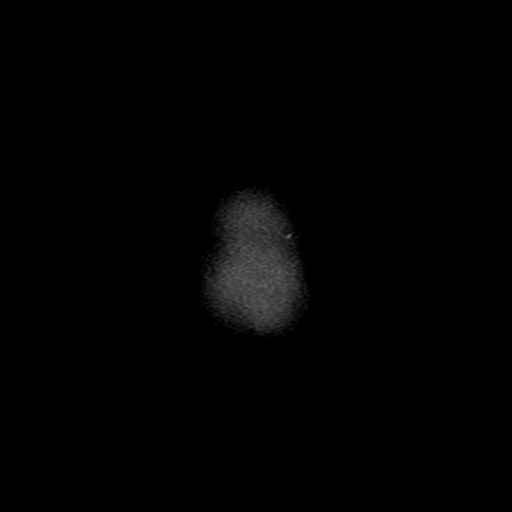

[Series 350: ADC · axial · 3.0mm · 0.94mm/px · z∈[-85,+62]mm · 4 of 52 slices shown (1 of 2)]
[im 1/52]
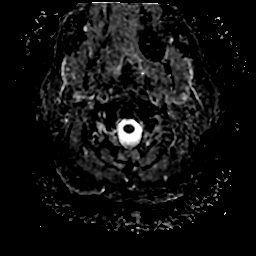
[im 18/52]
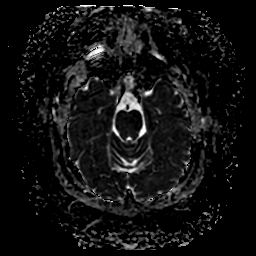
[im 35/52]
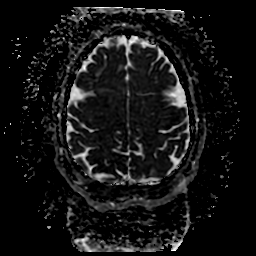
[im 52/52]
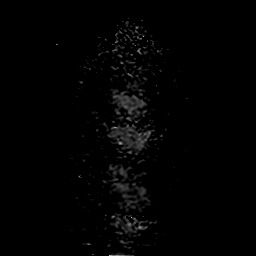

[Series 450: ADC · coronal · 4.0mm · 0.94mm/px · 3 of 37 slices shown (2 of 2)]
[im 1/37]
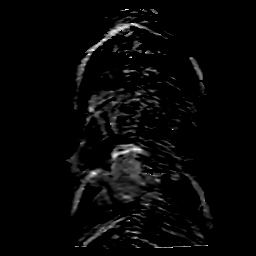
[im 19/37]
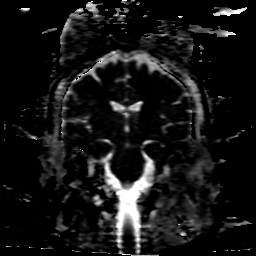
[im 37/37]
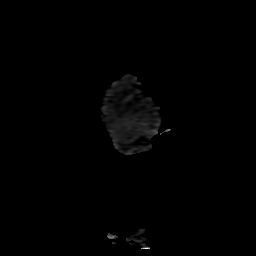

[24 of 48 positions shown; findings below may reference images not displayed]

FINDINGS: Brain: There is no acute infarction or intracranial hemorrhage.
There is no intracranial mass, mass effect, or edema. There is no
hydrocephalus or extra-axial fluid collection. Ventricles and sulci
are normal in size and configuration. Patchy foci of T2
hyperintensity in the supratentorial white matter are nonspecific
but may reflect mild chronic microvascular ischemic changes.

Vascular: Major vessel flow voids at the skull base are preserved.

Skull and upper cervical spine: Normal marrow signal is preserved.

Sinuses/Orbits: Paranasal sinuses are aerated. Orbits are
unremarkable.

Other: Sella is unremarkable.  Mastoid air cells are clear.
IMPRESSION: No acute infarction, hemorrhage, or mass. Mild probable chronic
microvascular ischemic changes.

## 2022-06-22 ENCOUNTER — Other Ambulatory Visit (HOSPITAL_COMMUNITY): Payer: Self-pay

## 2022-07-13 DIAGNOSIS — H40013 Open angle with borderline findings, low risk, bilateral: Secondary | ICD-10-CM | POA: Diagnosis not present

## 2022-07-13 DIAGNOSIS — H40053 Ocular hypertension, bilateral: Secondary | ICD-10-CM | POA: Diagnosis not present

## 2022-07-13 DIAGNOSIS — H2513 Age-related nuclear cataract, bilateral: Secondary | ICD-10-CM | POA: Diagnosis not present

## 2022-07-13 DIAGNOSIS — H04123 Dry eye syndrome of bilateral lacrimal glands: Secondary | ICD-10-CM | POA: Diagnosis not present

## 2022-07-21 ENCOUNTER — Other Ambulatory Visit (HOSPITAL_COMMUNITY): Payer: Self-pay

## 2022-07-22 ENCOUNTER — Telehealth: Payer: Self-pay | Admitting: *Deleted

## 2022-07-22 ENCOUNTER — Other Ambulatory Visit (HOSPITAL_COMMUNITY): Payer: Self-pay

## 2022-07-22 MED ORDER — PROGESTERONE 200 MG PO CAPS
ORAL_CAPSULE | ORAL | 3 refills | Status: DC
  Filled 2022-07-22: qty 36, 84d supply, fill #0

## 2022-07-22 NOTE — Telephone Encounter (Signed)
Dr.Lavoie replied "Can send Progesterone 200 mg/caps 1 caps PO HS day 1-12 of each month."    I called [facility] to make sure they have 200 mg dose and they do, Rx sent.

## 2022-07-22 NOTE — Telephone Encounter (Signed)
Patient called stating the pharmacy notified her that the progesterone 100 mg is on back order with no release date when it will be back in stock. Patient asked for alternative medication. It appears this is a nationwide problem other pharmacy's report the same. Please advise

## 2022-07-22 NOTE — Telephone Encounter (Signed)
Patient informed. Rx sent 

## 2022-09-02 ENCOUNTER — Other Ambulatory Visit (HOSPITAL_COMMUNITY): Payer: Self-pay

## 2022-10-05 ENCOUNTER — Telehealth: Payer: Self-pay

## 2022-10-05 ENCOUNTER — Other Ambulatory Visit (HOSPITAL_COMMUNITY): Payer: Self-pay

## 2022-10-05 MED ORDER — PROGESTERONE MICRONIZED 100 MG PO CAPS
100.0000 mg | ORAL_CAPSULE | Freq: Every day | ORAL | 2 refills | Status: DC
  Filled 2022-10-05: qty 90, 90d supply, fill #0
  Filled 2023-01-01: qty 90, 90d supply, fill #1
  Filled 2023-04-06: qty 90, 90d supply, fill #2

## 2022-10-05 NOTE — Telephone Encounter (Signed)
Elizabeth Bruins, MD  You24 minutes ago (3:44 PM)    Agree to switch back to Prometrium 100 mg HS.  If PMB persists after a month, schedule Pelvic US with me to investigate. Dr Carlean Jews

## 2022-10-05 NOTE — Telephone Encounter (Signed)
Patient had been taking 100 mg Prometrium daily until back in Nov 2023 when there was a shortage and she went to 200 mg Daily Day 1-12.  She did fine on it until this month when she has had spotting off and on for the last two weeks.  She said she even had it while she was taking the progesterone.  She is requesting to go back on the Daily 100 mg Prometrium.

## 2022-10-05 NOTE — Telephone Encounter (Signed)
I spoke with patient and relayed Dr. Assunta Curtis recommendation about reporting anymore bleeding after one month so we can investigate with u/s. She will call if persists.  Rx sent.

## 2022-11-10 ENCOUNTER — Other Ambulatory Visit: Payer: Self-pay

## 2022-11-12 ENCOUNTER — Other Ambulatory Visit (HOSPITAL_COMMUNITY): Payer: Self-pay

## 2022-12-24 ENCOUNTER — Other Ambulatory Visit (HOSPITAL_COMMUNITY): Payer: Self-pay

## 2023-01-11 ENCOUNTER — Other Ambulatory Visit (HOSPITAL_COMMUNITY): Payer: Self-pay

## 2023-01-11 ENCOUNTER — Encounter: Payer: Self-pay | Admitting: Nurse Practitioner

## 2023-01-11 ENCOUNTER — Ambulatory Visit: Payer: Commercial Managed Care - PPO | Admitting: Nurse Practitioner

## 2023-01-11 VITALS — BP 132/78 | HR 68 | Temp 98.3°F | Ht 64.25 in | Wt 232.0 lb

## 2023-01-11 DIAGNOSIS — Z6835 Body mass index (BMI) 35.0-35.9, adult: Secondary | ICD-10-CM

## 2023-01-11 DIAGNOSIS — Z1159 Encounter for screening for other viral diseases: Secondary | ICD-10-CM

## 2023-01-11 DIAGNOSIS — E782 Mixed hyperlipidemia: Secondary | ICD-10-CM | POA: Diagnosis not present

## 2023-01-11 DIAGNOSIS — Z23 Encounter for immunization: Secondary | ICD-10-CM | POA: Diagnosis not present

## 2023-01-11 DIAGNOSIS — R21 Rash and other nonspecific skin eruption: Secondary | ICD-10-CM | POA: Diagnosis not present

## 2023-01-11 DIAGNOSIS — E65 Localized adiposity: Secondary | ICD-10-CM | POA: Diagnosis not present

## 2023-01-11 DIAGNOSIS — Z1231 Encounter for screening mammogram for malignant neoplasm of breast: Secondary | ICD-10-CM | POA: Diagnosis not present

## 2023-01-11 MED ORDER — NYSTATIN 100000 UNIT/GM EX POWD
1.0000 | Freq: Three times a day (TID) | CUTANEOUS | 0 refills | Status: DC
Start: 2023-01-11 — End: 2024-06-26
  Filled 2023-01-11: qty 15, 5d supply, fill #0

## 2023-01-11 NOTE — Progress Notes (Signed)
I,Sheena H Holbrook,acting as a Neurosurgeon for [name], FNP.,have documented all relevant documentation on the behalf of [name], FNP,as directed by  [name], FNP while in the presence of [name], FNP.   Subjective:     Patient ID: Elizabeth [name] , female    DOB: [birth year] , 62 y.o.   MRN: [id number]   Chief Complaint  Patient presents with   Establish Care    HPI  Patient presents today to establish care. When she changed insurances and Dr. Manus Gunning. 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 She has 3 children 2 sons and 1 daughter. Her oldest two live in [place] area. Youngest son lives in [place] and she has 6 grandchildren.  She seen Dr. Manus Gunning in June 2023.   She denies a history of atrial fibrillation feels was related to the different diets she was on.   It is difficult for her to fall asleep and stay asleep. She works at [employer], generally works 7-330. She is taking doxepin she is to increase by 2 mg. She has a family history of DM   Dr. Sharyn Lull Patient states she is having issues with her excess lower abdominal skin and having consistent rashes to the area. She had erythema to her left panniculus area. This occurs every couple of weeks. She will use an antibiotic.  Patient is concerned about a fungal rash, she has been treating with OTC creams. She would like to consider having a panniculectomy. Patient has no other complaints or concerns.   She has a bone spur to her right hip - she would have to get the bone shaved. She reports having arthritis to her back. She works Mon - Fri -  Breakfast - omlette, coffee and substitue sugar.  Lunch - salad or hot dog Dinner -        Past Medical History:  Diagnosis Date   Arthritis    ASCUS (atypical squamous cells of undetermined significance) on Pap smear 99,04, 4/10   Atrial fibrillation (HCC)    Chest pain 03/05/2013   Colon polyps    Hypertension    Shortness of breath 03/06/2013   " i had shortness of  breath with my chest pain "     Family History  Problem Relation Age of Onset   Hypertension Mother    Diabetes Mother    Heart disease Mother    Heart disease Father    Cancer Father        stomach   Hypertension Father    Heart disease Maternal Grandmother    Hypertension Sister    Heart disease Brother    Diabetes Brother    Hypertension Brother      Current Outpatient Medications:    aspirin EC 81 MG tablet, Take 81 mg by mouth daily., Disp: , Rfl:    atorvastatin (LIPITOR) 10 MG tablet, Take 1 tablet (10 mg total) by mouth daily for cholesterol, Disp: 90 tablet, Rfl: 3   doxepin (SINEQUAN) 10 MG capsule, Take 2 capsules (20 mg total) by mouth before bedtime as needed for sleep, Disp: 180 capsule, Rfl: 3   estradiol (ESTRACE) 0.5 MG tablet, Take 1 tablet (0.5 mg total) by mouth daily., Disp: 90 tablet, Rfl: 4   lisinopril-hydrochlorothiazide (ZESTORETIC) 20-12.5 MG tablet, Take 1 tablet by mouth daily for blood pressure, Disp: 90 tablet, Rfl: 3   nystatin powder, Apply 1 Application topically 3 (three) times daily., Disp: 15 g, Rfl: 0   progesterone (PROMETRIUM) 100  MG capsule, Take 1 capsule (100 mg total) by mouth daily., Disp: 90 capsule, Rfl: 2   No Known Allergies     Review of Systems  Skin:  Positive for rash.  All other systems reviewed and are negative.    Today's Vitals   01/11/23 1508  BP: 132/78  Pulse: 68  Temp: 98.3 F (36.8 C)  TempSrc: Oral  SpO2: 94%  Weight: 232 lb (105.2 kg)  Height: 5' 4.25" (1.632 m)   Body mass index is 39.51 kg/m.   Objective:  Physical Exam Vitals reviewed.  Constitutional:      General: She is not in acute distress.    Appearance: Normal appearance. She is well-developed. She is obese.  HENT:     Head: Normocephalic and atraumatic.  Eyes:     Pupils: Pupils are equal, round, and reactive to light.  Cardiovascular:     Rate and Rhythm: Normal rate and regular rhythm.     Pulses: Normal pulses.     Heart  sounds: Normal heart sounds. No murmur heard. Pulmonary:     Effort: Pulmonary effort is normal. No respiratory distress.     Breath sounds: Normal breath sounds. No wheezing.  Musculoskeletal:        General: Normal range of motion.  Skin:    General: Skin is warm and dry.     Capillary Refill: Capillary refill takes less than 2 seconds.  Neurological:     General: No focal deficit present.     Mental Status: She is alert and oriented to person, place, and time.     Cranial Nerves: No cranial nerve deficit.     Motor: No weakness.  Psychiatric:        Mood and Affect: Mood normal.         Assessment And Plan:     1. Mixed hyperlipidemia Comments: Continue statin, tolerating well. - Lipid panel - CBC - CMP14+EGFR  2. Rash and nonspecific skin eruption - nystatin powder; Apply 1 Application topically 3 (three) times daily.  Dispense: 15 g; Refill: 0 - Ambulatory referral to Plastic Surgery  3. Abdominal panniculus Comments: She would like to see a plastic surgeon to look at her extra skin. No rash noted at this time - nystatin powder; Apply 1 Application topically 3 (three) times daily.  Dispense: 15 g; Refill: 0 - Ambulatory referral to Plastic Surgery  4. Class 2 severe obesity due to excess calories with serious comorbidity and body mass index (BMI) of 35.0 to 35.9 in adult (HCC) - Hemoglobin A1c - TSH  5. Encounter for hepatitis C screening test for low risk patient Will check Hepatitis C screening due to recent recommendations to screen all adults 18 years and older - Hepatitis C antibody  6. Need for Tdap vaccination Tetanus given in office - Tdap vaccine greater than or equal to 7yo IM    Patient was given opportunity to ask questions. Patient verbalized understanding of the plan and was able to repeat key elements of the plan. All questions were answered to their satisfaction.   [name], FNP   I, [name], FNP, have reviewed all documentation for  this visit. The documentation on 01/11/23 for the exam, diagnosis, procedures, and orders are all accurate and complete.   THE PATIENT IS ENCOURAGED TO PRACTICE SOCIAL DISTANCING DUE TO THE COVID-19 PANDEMIC.

## 2023-01-12 LAB — LIPID PANEL
Chol/HDL Ratio: 4 ratio (ref 0.0–4.4)
Cholesterol, Total: 210 mg/dL — ABNORMAL HIGH (ref 100–199)
HDL: 52 mg/dL (ref >39)
LDL Chol Calc (NIH): 124 mg/dL — ABNORMAL HIGH (ref 0–99)
Triglycerides: 193 mg/dL — ABNORMAL HIGH (ref 0–149)
VLDL Cholesterol Cal: 34 mg/dL (ref 5–40)

## 2023-01-12 LAB — HEPATITIS C ANTIBODY: Hep C Virus Ab: NONREACTIVE

## 2023-01-12 LAB — CBC
Hematocrit: 40.4 % (ref 34.0–46.6)
Hemoglobin: 13 g/dL (ref 11.1–15.9)
MCH: 29.2 pg (ref 26.6–33.0)
MCHC: 32.2 g/dL (ref 31.5–35.7)
MCV: 91 fL (ref 79–97)
Platelets: 321 10*3/uL (ref 150–450)
RBC: 4.45 x10E6/uL (ref 3.77–5.28)
RDW: 13 % (ref 11.7–15.4)
WBC: 6.5 10*3/uL (ref 3.4–10.8)

## 2023-01-12 LAB — TSH: TSH: 1.37 u[IU]/mL (ref 0.450–4.500)

## 2023-01-12 LAB — CMP14+EGFR
ALT: 15 IU/L (ref 0–32)
AST: 17 IU/L (ref 0–40)
Albumin/Globulin Ratio: 1.7 (ref 1.2–2.2)
Albumin: 4.5 g/dL (ref 3.9–4.9)
Alkaline Phosphatase: 96 IU/L (ref 44–121)
BUN/Creatinine Ratio: 16 (ref 12–28)
BUN: 13 mg/dL (ref 8–27)
Bilirubin Total: 0.2 mg/dL (ref 0.0–1.2)
CO2: 22 mmol/L (ref 20–29)
Calcium: 10.2 mg/dL (ref 8.7–10.3)
Chloride: 100 mmol/L (ref 96–106)
Creatinine, Ser: 0.81 mg/dL (ref 0.57–1.00)
Globulin, Total: 2.6 g/dL (ref 1.5–4.5)
Glucose: 86 mg/dL (ref 70–99)
Potassium: 4.1 mmol/L (ref 3.5–5.2)
Sodium: 138 mmol/L (ref 134–144)
Total Protein: 7.1 g/dL (ref 6.0–8.5)
eGFR: 83 mL/min/{1.73_m2} (ref >59)

## 2023-01-12 LAB — HEMOGLOBIN A1C
Est. average glucose Bld gHb Est-mCnc: 126 mg/dL
Hgb A1c MFr Bld: 6 % — ABNORMAL HIGH (ref 4.8–5.6)

## 2023-01-20 MED ORDER — ATORVASTATIN CALCIUM 10 MG PO TABS
10.0000 mg | ORAL_TABLET | Freq: Every day | ORAL | 3 refills | Status: DC
Start: 2023-01-20 — End: 2023-01-30
  Filled 2023-01-20: qty 90, 90d supply, fill #0

## 2023-01-21 ENCOUNTER — Other Ambulatory Visit (HOSPITAL_COMMUNITY): Payer: Self-pay

## 2023-01-21 ENCOUNTER — Encounter: Payer: Self-pay | Admitting: Nurse Practitioner

## 2023-01-30 ENCOUNTER — Other Ambulatory Visit: Payer: Self-pay | Admitting: Nurse Practitioner

## 2023-01-30 DIAGNOSIS — E782 Mixed hyperlipidemia: Secondary | ICD-10-CM

## 2023-01-30 MED ORDER — ATORVASTATIN CALCIUM 20 MG PO TABS
20.0000 mg | ORAL_TABLET | Freq: Every day | ORAL | 1 refills | Status: DC
  Filled 2023-01-30: qty 90, 90d supply, fill #0
  Filled 2023-05-04: qty 90, 90d supply, fill #1

## 2023-01-30 MED ORDER — ATORVASTATIN CALCIUM 20 MG PO TABS
20.0000 mg | ORAL_TABLET | Freq: Every day | ORAL | 11 refills | Status: DC
  Filled 2023-01-30: qty 30, 30d supply, fill #0

## 2023-01-31 ENCOUNTER — Other Ambulatory Visit (HOSPITAL_COMMUNITY): Payer: Self-pay

## 2023-01-31 ENCOUNTER — Other Ambulatory Visit: Payer: Self-pay

## 2023-02-01 ENCOUNTER — Other Ambulatory Visit (HOSPITAL_COMMUNITY): Payer: Self-pay

## 2023-02-01 DIAGNOSIS — M545 Low back pain, unspecified: Secondary | ICD-10-CM | POA: Diagnosis not present

## 2023-02-01 DIAGNOSIS — M25551 Pain in right hip: Secondary | ICD-10-CM | POA: Diagnosis not present

## 2023-02-01 DIAGNOSIS — M25562 Pain in left knee: Secondary | ICD-10-CM | POA: Diagnosis not present

## 2023-02-01 MED ORDER — MELOXICAM 15 MG PO TABS
15.0000 mg | ORAL_TABLET | Freq: Every day | ORAL | 2 refills | Status: AC
  Filled 2023-02-01: qty 30, 30d supply, fill #0
  Filled 2023-06-13: qty 30, 30d supply, fill #1
  Filled 2023-07-18 – 2023-07-20 (×3): qty 30, 30d supply, fill #2

## 2023-02-01 MED ORDER — METHOCARBAMOL 500 MG PO TABS
500.0000 mg | ORAL_TABLET | Freq: Four times a day (QID) | ORAL | 2 refills | Status: AC | PRN
  Filled 2023-02-01: qty 40, 10d supply, fill #0
  Filled 2023-06-13: qty 40, 10d supply, fill #1
  Filled 2023-09-12: qty 40, 10d supply, fill #2

## 2023-02-09 ENCOUNTER — Encounter: Payer: Self-pay | Admitting: Nurse Practitioner

## 2023-02-10 ENCOUNTER — Encounter: Payer: Self-pay | Admitting: Nurse Practitioner

## 2023-02-10 ENCOUNTER — Telehealth: Payer: Commercial Managed Care - PPO | Admitting: Nurse Practitioner

## 2023-02-10 ENCOUNTER — Other Ambulatory Visit: Payer: Self-pay

## 2023-02-10 ENCOUNTER — Other Ambulatory Visit (HOSPITAL_COMMUNITY): Payer: Self-pay

## 2023-02-10 DIAGNOSIS — R051 Acute cough: Secondary | ICD-10-CM | POA: Diagnosis not present

## 2023-02-10 DIAGNOSIS — U071 COVID-19: Secondary | ICD-10-CM | POA: Insufficient documentation

## 2023-02-10 MED ORDER — HYDROCODONE BIT-HOMATROP MBR 5-1.5 MG/5ML PO SOLN
5.0000 mL | Freq: Four times a day (QID) | ORAL | 0 refills | Status: DC | PRN
Start: 2023-02-10 — End: 2023-04-14
  Filled 2023-02-10: qty 120, 6d supply, fill #0

## 2023-02-10 MED ORDER — MOLNUPIRAVIR 200 MG PO CAPS
4.0000 | ORAL_CAPSULE | Freq: Two times a day (BID) | ORAL | 0 refills | Status: AC
  Filled 2023-02-10: qty 40, 5d supply, fill #0

## 2023-02-10 NOTE — Progress Notes (Signed)
Virtual Visit via Mychart   This visit type was conducted due to national recommendations for restrictions regarding the COVID-19 Pandemic (e.g. social distancing) in an effort to limit this patient's exposure and mitigate transmission in our community.  Due to her co-morbid illnesses, this patient is at least at moderate risk for complications without adequate follow up.  This format is felt to be most appropriate for this patient at this time.  All issues noted in this document were discussed and addressed.  A limited physical exam was performed with this format.    This visit type was conducted due to national recommendations for restrictions regarding the COVID-19 Pandemic (e.g. social distancing) in an effort to limit this patient's exposure and mitigate transmission in our community.  Patients identity confirmed using two different identifiers.  This format is felt to be most appropriate for this patient at this time.  All issues noted in this document were discussed and addressed.  No physical exam was performed (except for noted visual exam findings with Video Visits).    Date:  02/10/2023   ID:  AUNYE NANNEY, DOB 1961/05/01, MRN [id number]  Patient Location:  Home - spoke with Ashley Mariner  Provider location:   Office    Chief Complaint:  positive home covid test/cough  History of Present Illness:    RANESSA PETRENKO is a 62 y.o. female who presents via video conferencing for a telehealth visit today.    The patient does have symptoms concerning for COVID-19 infection (fever, chills, cough, or new shortness of breath).   Patient presents today for positive covid test at work. Symptoms started on Tuesday at work. Including cough, stuffiness and body aches and unable to sleep due to the cough. She has not lost her taste or smell. No fever. She is drinking. She is able to take her medications. She has not taken any cold symptoms. She has been taking delsym. Patient reports first  feeling bad on Wednesday. Patient reports having a bad dry cough she has soreness to her abdominal wall and chest wall. and would just like cough syrup.      Past Medical History:  Diagnosis Date   Arthritis    ASCUS (atypical squamous cells of undetermined significance) on Pap smear 99,04, 4/10   Atrial fibrillation (HCC)    Chest pain 03/05/2013   Colon polyps    Hypertension    Shortness of breath 03/06/2013   " i had shortness of breath with my chest pain "   Past Surgical History:  Procedure Laterality Date   CARDIAC CATHETERIZATION  3/09   TONSILLECTOMY     TUBAL LIGATION       No outpatient medications have been marked as taking for the 02/10/23 encounter (Appointment) with [name], FNP.     Allergies:   Patient has no known allergies.   Social History   Tobacco Use   Smoking status: Never   Smokeless tobacco: Never  Vaping Use   Vaping Use: Never used  Substance Use Topics   Alcohol use: Not Currently   Drug use: No     Family Hx: The patient's family history includes Cancer in her father; Diabetes in her brother and mother; Heart disease in her brother, father, maternal grandmother, and mother; Hypertension in her brother, father, mother, and sister.  ROS:   Please see the history of present illness.    Review of Systems  Constitutional:  Positive for malaise/fatigue. Negative for chills and fever.  HENT:  Negative.    Respiratory:  Positive for cough. Negative for sputum production, shortness of breath and wheezing.   Cardiovascular: Negative.   Musculoskeletal:  Positive for myalgias.  Neurological:  Negative for dizziness and headaches.  Psychiatric/Behavioral: Negative.      All other systems reviewed and are negative.   Labs/Other Tests and Data Reviewed:    Recent Labs: 01/11/2023: ALT 15; BUN 13; Creatinine, Ser 0.81; Hemoglobin 13.0; Platelets 321; Potassium 4.1; Sodium 138; TSH 1.370   Recent Lipid Panel Lab Results  Component Value  Date/Time   CHOL 210 (H) 01/11/2023 04:01 PM   TRIG 193 (H) 01/11/2023 04:01 PM   HDL 52 01/11/2023 04:01 PM   CHOLHDL 4.0 01/11/2023 04:01 PM   CHOLHDL 3.8 07/03/2021 05:03 AM   LDLCALC 124 (H) 01/11/2023 04:01 PM    Wt Readings from Last 3 Encounters:  01/11/23 232 lb (105.2 kg)  04/23/22 226 lb (102.5 kg)  07/02/21 230 lb (104.3 kg)     Exam:    Vital Signs:  LMP 10/02/2013 Comment: BTL    Physical Exam Vitals reviewed.  Constitutional:      General: She is not in acute distress.    Appearance: Normal appearance.  Pulmonary:     Effort: Pulmonary effort is normal. No respiratory distress.  Neurological:     General: No focal deficit present.     Mental Status: She is alert and oriented to person, place, and time. Mental status is at baseline.     Cranial Nerves: No cranial nerve deficit.  Psychiatric:        Mood and Affect: Mood and affect normal.        Behavior: Behavior normal.        Thought Content: Thought content normal.        Cognition and Memory: Memory normal.        Judgment: Judgment normal.     ASSESSMENT & PLAN:    1. Positive self-administered antigen test for COVID-19 Advised patient to take Vitamin C, D, Zinc.  Keep yourself hydrated with a lot of water and rest. Take Delsym for cough and Mucinex as need. Take Tylenol or pain reliever every 4-6 hours as needed for pain/fever/body ache. If you have elevated blood pressure, you can take OTC Corcidin. You can also take OTC oscillococcinum to help with your symptoms.  Educated patient if symptoms get worse or if she experiences any SOB, chest pain or pain in her legs to seek immediate emergency care. Continue to monitor your oxygen levels. Call us if you have any questions. Quarantine for 5 days if tested positive and no symptoms or 10 days if tested positive and have symptoms. Wear a mask around other people. Her job has her out of work until Monday.   2. Acute cough  - HYDROcodone bit-homatropine  (HYDROMET) 5-1.5 MG/5ML syrup; Take 5 mLs by mouth every 6 (six) hours as needed for cough.  Dispense: 120 mL; Refill: 0     COVID-19 Education: The signs and symptoms of COVID-19 were discussed with the patient and how to seek care for testing (follow up with PCP or arrange E-visit).  The importance of social distancing was discussed today.  Patient Risk:   After full review of this patients clinical status, I feel that they are at least moderate risk at this time.  Time:   Today, I have spent 9.55 minutes/ seconds with the patient with telehealth technology discussing above diagnoses.     Medication Adjustments/Labs and  Tests Ordered: Current medicines are reviewed at length with the patient today.  Concerns regarding medicines are outlined above.   Tests Ordered: No orders of the defined types were placed in this encounter.   Medication Changes: No orders of the defined types were placed in this encounter.   Disposition:  Follow up prn  Signed, Marlyn Corporal, CMA

## 2023-02-10 NOTE — Patient Instructions (Addendum)
Advised patient to take Vitamin C, D, Zinc.  Keep yourself hydrated with a lot of water and rest. Take Delsym for cough and Mucinex as need. Take Tylenol or pain reliever every 4-6 hours as needed for pain/fever/body ache. If you have elevated blood pressure, you can take OTC Corcidin. You can also take OTC oscillococcinum to help with your symptoms. If you experience any SOB, chest pain or pain in her legs to seek immediate emergency care. Continue to monitor your oxygen levels. Call us if you have any questions. Quarantine for 5 days if tested positive and no symptoms or 10 days if tested positive and have symptoms. Wear a mask around other people. Avoid drinking dairy due to the cough. You can take melatonin as needed to help with sleep.

## 2023-02-15 ENCOUNTER — Other Ambulatory Visit (HOSPITAL_COMMUNITY): Payer: Self-pay

## 2023-02-23 ENCOUNTER — Ambulatory Visit (INDEPENDENT_AMBULATORY_CARE_PROVIDER_SITE_OTHER): Payer: Commercial Managed Care - PPO | Admitting: Nurse Practitioner

## 2023-02-23 ENCOUNTER — Other Ambulatory Visit (HOSPITAL_COMMUNITY): Payer: Self-pay

## 2023-02-23 VITALS — BP 130/70 | HR 85 | Temp 98.4°F | Ht 64.0 in | Wt 230.8 lb

## 2023-02-23 DIAGNOSIS — Z6839 Body mass index (BMI) 39.0-39.9, adult: Secondary | ICD-10-CM

## 2023-02-23 DIAGNOSIS — I10 Essential (primary) hypertension: Secondary | ICD-10-CM

## 2023-02-23 DIAGNOSIS — E6609 Other obesity due to excess calories: Secondary | ICD-10-CM | POA: Insufficient documentation

## 2023-02-23 MED ORDER — PHENTERMINE HCL 15 MG PO CAPS
15.0000 mg | ORAL_CAPSULE | ORAL | 1 refills | Status: DC
Start: 2023-02-23 — End: 2023-04-14
  Filled 2023-02-23: qty 30, 30d supply, fill #0
  Filled 2023-03-20: qty 30, 30d supply, fill #1

## 2023-02-23 NOTE — Progress Notes (Signed)
Hershal Coria Martin,acting as a Neurosurgeon for [name], FNP.,have documented all relevant documentation on the behalf of [name], FNP,as directed by  [name], FNP while in the presence of [name], FNP.    Subjective:     Patient ID: Elizabeth [name] , female    DOB: 1961-05-30 , 62 y.o.   MRN: [id number]   Chief Complaint  Patient presents with   Obesity    HPI  Patient presents today wanting to start a weight loss medication. She has tried weight watchers, keto diet. She has never taken any medications in the past. She is not doing any physical activity due to having a bone spur to right hip and left knee. She does walk a lot at work. She has done premier shakes with [employer].   Breakfast - fruit - cantaloupe, blueberries and pineapple Lunch - flatbread pizza Dinner - popcorn, she does not cook since it is just her. (She will do a protein bowl from [place] that will last 2 days).     Patient reports compliance with medications and has no other concerns today. Patient denies any chest pain, SOB, or headaches.  She recently had Covid  Wt Readings from Last 3 Encounters: 02/23/23 : 230 lb 12.8 oz (104.7 kg) 01/11/23 : 232 lb (105.2 kg) 04/23/22 : 226 lb (102.5 kg)  BP Readings from Last 3 Encounters: 02/23/23 : 130/70 01/11/23 : 132/78 04/23/22 : 120/76       Past Medical History:  Diagnosis Date   Arthritis    ASCUS (atypical squamous cells of undetermined significance) on Pap smear 99,04, 4/10   Atrial fibrillation (HCC)    Chest pain 03/05/2013   Colon polyps    Hypertension    Shortness of breath 03/06/2013   " i had shortness of breath with my chest pain "     Family History  Problem Relation Age of Onset   Hypertension Mother    Diabetes Mother    Heart disease Mother    Heart disease Father    Cancer Father        stomach   Hypertension Father    Heart disease Maternal Grandmother    Hypertension Sister    Heart disease Brother     Diabetes Brother    Hypertension Brother      Current Outpatient Medications:    aspirin EC 81 MG tablet, Take 81 mg by mouth daily., Disp: , Rfl:    atorvastatin (LIPITOR) 20 MG tablet, Take 1 tablet (20 mg total) by mouth daily., Disp: 90 tablet, Rfl: 1   doxepin (SINEQUAN) 10 MG capsule, Take 2 capsules (20 mg total) by mouth before bedtime as needed for sleep, Disp: 180 capsule, Rfl: 3   estradiol (ESTRACE) 0.5 MG tablet, Take 1 tablet (0.5 mg total) by mouth daily., Disp: 90 tablet, Rfl: 4   HYDROcodone bit-homatropine (HYDROMET) 5-1.5 MG/5ML syrup, Take 5 mLs by mouth every 6 (six) hours as needed for cough., Disp: 120 mL, Rfl: 0   lisinopril-hydrochlorothiazide (ZESTORETIC) 20-12.5 MG tablet, Take 1 tablet by mouth daily for blood pressure, Disp: 90 tablet, Rfl: 3   meloxicam (MOBIC) 15 MG tablet, Take 1 tablet (15 mg total) by mouth once daily with food/meal, Disp: 30 tablet, Rfl: 2   methocarbamol (ROBAXIN) 500 MG tablet, Take 1 tablet (500 mg total) by mouth every 6-8 hours as needed., Disp: 40 tablet, Rfl: 2   nystatin powder, Apply 1 Application topically 3 (three) times daily., Disp: 15  g, Rfl: 0   phentermine 15 MG capsule, Take 1 capsule (15 mg total) by mouth every morning., Disp: 30 capsule, Rfl: 1   progesterone (PROMETRIUM) 100 MG capsule, Take 1 capsule (100 mg total) by mouth daily., Disp: 90 capsule, Rfl: 2   No Known Allergies   Review of Systems  Constitutional: Negative.   Respiratory: Negative.    Cardiovascular: Negative.   Skin:  Positive for rash.  Psychiatric/Behavioral: Negative.       Today's Vitals   02/23/23 1553  BP: 130/70  Pulse: 85  Temp: 98.4 F (36.9 C)  TempSrc: Oral  Weight: 230 lb 12.8 oz (104.7 kg)  Height: 5\' 4"  (1.626 m)  PainSc: 0-No pain   Body mass index is 39.62 kg/m.   Objective:  Physical Exam Vitals reviewed.  Constitutional:      General: She is not in acute distress.    Appearance: Normal appearance. She is  well-developed. She is obese.  HENT:     Head: Normocephalic and atraumatic.  Eyes:     Pupils: Pupils are equal, round, and reactive to light.  Cardiovascular:     Rate and Rhythm: Normal rate and regular rhythm.     Pulses: Normal pulses.     Heart sounds: Normal heart sounds. No murmur heard. Pulmonary:     Effort: Pulmonary effort is normal. No respiratory distress.     Breath sounds: Normal breath sounds. No wheezing.  Musculoskeletal:        General: Normal range of motion.  Skin:    General: Skin is warm and dry.     Capillary Refill: Capillary refill takes less than 2 seconds.  Neurological:     General: No focal deficit present.     Mental Status: She is alert and oriented to person, place, and time.     Cranial Nerves: No cranial nerve deficit.     Motor: No weakness.  Psychiatric:        Mood and Affect: Mood normal.         Assessment And Plan:     1. Primary hypertension Comments: Blood pressure is borderline, continue current medications  2. Class 2 obesity due to excess calories without serious comorbidity with body mass index (BMI) of 39.0 to 39.9 in adult Comments: Will start her on phentermine low dose, discussed side effects of palpitations and dry mouth, - phentermine 15 MG capsule; Take 1 capsule (15 mg total) by mouth every morning.  Dispense: 30 capsule; Refill: 1    Return if symptoms worsen or fail to improve, for has appt in July.   Patient was given opportunity to ask questions. Patient verbalized understanding of the plan and was able to repeat key elements of the plan. All questions were answered to their satisfaction.  [name], FNP   I, [name], FNP, have reviewed all documentation for this visit. The documentation on 02/23/23 for the exam, diagnosis, procedures, and orders are all accurate and complete.   IF YOU HAVE BEEN REFERRED TO A SPECIALIST, IT MAY TAKE 1-2 WEEKS TO SCHEDULE/PROCESS THE REFERRAL. IF YOU HAVE NOT HEARD FROM  US/SPECIALIST IN TWO WEEKS, PLEASE GIVE [place] A CALL AT [phone] X [phone].   THE PATIENT IS ENCOURAGED TO PRACTICE SOCIAL DISTANCING DUE TO THE COVID-19 PANDEMIC.

## 2023-02-23 NOTE — Patient Instructions (Addendum)
Goal to exercise 150 minutes per week with at least 2 days of strength training Encouraged to park further when at the store, take stairs instead of elevators and to walk in place during commercials. Increase water intake to at least one gallon of water daily. Goal to lose 1-2 lbs per week.

## 2023-03-20 ENCOUNTER — Other Ambulatory Visit (HOSPITAL_COMMUNITY): Payer: Self-pay

## 2023-03-21 ENCOUNTER — Other Ambulatory Visit: Payer: Self-pay

## 2023-03-22 ENCOUNTER — Other Ambulatory Visit: Payer: Self-pay

## 2023-03-22 ENCOUNTER — Other Ambulatory Visit (HOSPITAL_COMMUNITY): Payer: Self-pay

## 2023-03-22 MED ORDER — LISINOPRIL-HYDROCHLOROTHIAZIDE 20-12.5 MG PO TABS
1.0000 | ORAL_TABLET | Freq: Every day | ORAL | 3 refills | Status: DC
  Filled 2023-03-22: qty 90, 90d supply, fill #0
  Filled 2023-06-13: qty 90, 90d supply, fill #1
  Filled 2023-09-12: qty 90, 90d supply, fill #2
  Filled 2023-12-09: qty 90, 90d supply, fill #3

## 2023-04-11 ENCOUNTER — Other Ambulatory Visit (HOSPITAL_BASED_OUTPATIENT_CLINIC_OR_DEPARTMENT_OTHER): Payer: Self-pay

## 2023-04-14 ENCOUNTER — Encounter: Payer: Self-pay | Admitting: Nurse Practitioner

## 2023-04-14 ENCOUNTER — Other Ambulatory Visit (HOSPITAL_COMMUNITY): Payer: Self-pay

## 2023-04-14 ENCOUNTER — Ambulatory Visit (INDEPENDENT_AMBULATORY_CARE_PROVIDER_SITE_OTHER): Payer: Commercial Managed Care - PPO | Admitting: Nurse Practitioner

## 2023-04-14 VITALS — BP 130/68 | HR 66 | Temp 98.4°F | Ht 64.0 in | Wt 227.0 lb

## 2023-04-14 DIAGNOSIS — I10 Essential (primary) hypertension: Secondary | ICD-10-CM | POA: Diagnosis not present

## 2023-04-14 DIAGNOSIS — E782 Mixed hyperlipidemia: Secondary | ICD-10-CM | POA: Diagnosis not present

## 2023-04-14 DIAGNOSIS — Z6838 Body mass index (BMI) 38.0-38.9, adult: Secondary | ICD-10-CM

## 2023-04-14 DIAGNOSIS — Z0000 Encounter for general adult medical examination without abnormal findings: Secondary | ICD-10-CM | POA: Insufficient documentation

## 2023-04-14 DIAGNOSIS — Z2821 Immunization not carried out because of patient refusal: Secondary | ICD-10-CM | POA: Diagnosis not present

## 2023-04-14 DIAGNOSIS — L550 Sunburn of first degree: Secondary | ICD-10-CM | POA: Insufficient documentation

## 2023-04-14 DIAGNOSIS — E6609 Other obesity due to excess calories: Secondary | ICD-10-CM

## 2023-04-14 DIAGNOSIS — Z79899 Other long term (current) drug therapy: Secondary | ICD-10-CM

## 2023-04-14 MED ORDER — PHENTERMINE HCL 37.5 MG PO CAPS
37.5000 mg | ORAL_CAPSULE | ORAL | 1 refills | Status: DC
Start: 2023-04-14 — End: 2023-06-06
  Filled 2023-04-14: qty 30, 30d supply, fill #0
  Filled 2023-05-15: qty 30, 30d supply, fill #1

## 2023-04-14 NOTE — Patient Instructions (Signed)
You can use aloe to your sunburn to help with the peeling Goal to exercise 150 minutes per week with at least 2 days of strength training Encouraged to park further when at the store, take stairs instead of elevators and to walk in place during commercials. Increase water intake to at least one gallon of water daily.

## 2023-04-14 NOTE — Progress Notes (Unsigned)
[name], CMA,acting as a Neurosurgeon for [name], FNP.,have documented all relevant documentation on the behalf of [name], FNP,as directed by  [name], FNP while in the presence of [name], FNP.  Subjective:    Patient ID: Elizabeth [name] , female    DOB: August 24, 1961 , 62 y.o.   MRN: [id number]  Chief Complaint  Patient presents with   Annual Exam    HPI  Patient presents today for HM, patient reports compliance with medications. Patient denies any chest pain, SOB, or headaches. Patient has no other concerns today. She is followed by GYN.   BP Readings from Last 3 Encounters: 04/14/23 : 130/68 02/23/23 : 130/70 01/11/23 : 132/78   Wt Readings from Last 3 Encounters: 04/14/23 : 227 lb (103 kg) 02/23/23 : 230 lb 12.8 oz (104.7 kg) 01/11/23 : 232 lb (105.2 kg)      Past Medical History:  Diagnosis Date   Arthritis    ASCUS (atypical squamous cells of undetermined significance) on Pap smear 99,04, 4/10   Atrial fibrillation (HCC)    Chest pain 03/05/2013   Colon polyps    Hypertension    Shortness of breath 03/06/2013   " i had shortness of breath with my chest pain "     Family History  Problem Relation Age of Onset   Hypertension Mother    Diabetes Mother    Heart disease Mother    Heart disease Father    Cancer Father        stomach   Hypertension Father    Heart disease Maternal Grandmother    Hypertension Sister    Heart disease Brother    Diabetes Brother    Hypertension Brother      Current Outpatient Medications:    aspirin EC 81 MG tablet, Take 81 mg by mouth daily., Disp: , Rfl:    atorvastatin (LIPITOR) 20 MG tablet, Take 1 tablet (20 mg total) by mouth daily., Disp: 90 tablet, Rfl: 1   doxepin (SINEQUAN) 10 MG capsule, Take 2 capsules (20 mg total) by mouth before bedtime as needed for sleep, Disp: 180 capsule, Rfl: 3   estradiol (ESTRACE) 0.5 MG tablet, Take 1 tablet (0.5 mg total) by mouth daily., Disp: 90 tablet, Rfl: 4    lisinopril-hydrochlorothiazide (ZESTORETIC) 20-12.5 MG tablet, Take 1 tablet by mouth daily for blood pressure, Disp: 90 tablet, Rfl: 3   meloxicam (MOBIC) 15 MG tablet, Take 1 tablet (15 mg total) by mouth once daily with food/meal, Disp: 30 tablet, Rfl: 2   methocarbamol (ROBAXIN) 500 MG tablet, Take 1 tablet (500 mg total) by mouth every 6-8 hours as needed., Disp: 40 tablet, Rfl: 2   nystatin powder, Apply 1 Application topically 3 (three) times daily., Disp: 15 g, Rfl: 0   progesterone (PROMETRIUM) 100 MG capsule, Take 1 capsule (100 mg total) by mouth daily., Disp: 90 capsule, Rfl: 2   phentermine 37.5 MG capsule, Take 1 capsule (37.5 mg total) by mouth every morning., Disp: 30 capsule, Rfl: 1   No Known Allergies    The patient states she uses post menopausal status for birth control. Patient's last menstrual period was 10/02/2013.. Negative for Dysmenorrhea and Negative for Menorrhagia. Negative for: breast discharge, breast lump(s), breast pain and breast self exam. Associated symptoms include abnormal vaginal bleeding. Pertinent negatives include abnormal bleeding (hematology), anxiety, decreased libido, depression, difficulty falling sleep, dyspareunia, history of infertility, nocturia, sexual dysfunction, sleep disturbances, urinary incontinence, urinary urgency, vaginal discharge and vaginal  itching. Diet regular; better. The patient states her exercise level is minimal but is trying to get some in. She is currently taking phentermine.   The patient's tobacco use is:  Social History   Tobacco Use  Smoking Status Never  Smokeless Tobacco Never   She has been exposed to passive smoke. The patient's alcohol use is:  Social History   Substance and Sexual Activity  Alcohol Use Not Currently  Additional information: Last pap 04/23/2022, next one scheduled for 04/23/2025.    Review of Systems  Constitutional: Negative.   HENT: Negative.    Eyes: Negative.   Respiratory: Negative.     Cardiovascular: Negative.   Gastrointestinal: Negative.   Endocrine: Negative.   Genitourinary: Negative.   Musculoskeletal: Negative.   Skin:  Positive for rash (peeling skin).  Allergic/Immunologic: Negative.   Neurological: Negative.   Hematological: Negative.   Psychiatric/Behavioral: Negative.       Today's Vitals   04/14/23 1526  BP: 130/68  Pulse: 66  Temp: 98.4 F (36.9 C)  TempSrc: Oral  Weight: 227 lb (103 kg)  Height: 5\' 4"  (1.626 m)  PainSc: 0-No pain   Body mass index is 38.96 kg/m.  Wt Readings from Last 3 Encounters:  04/22/23 223 lb (101.2 kg)  04/14/23 227 lb (103 kg)  02/23/23 230 lb 12.8 oz (104.7 kg)     Objective:  Physical Exam Vitals reviewed.  Constitutional:      General: She is not in acute distress.    Appearance: Normal appearance. She is well-developed. She is obese.  HENT:     Head: Normocephalic and atraumatic.     Right Ear: Hearing, tympanic membrane, ear canal and external ear normal. There is no impacted cerumen.     Left Ear: Hearing, tympanic membrane, ear canal and external ear normal. There is no impacted cerumen.     Nose: Nose normal.     Mouth/Throat:     Mouth: Mucous membranes are moist.  Eyes:     General: Lids are normal.     Extraocular Movements: Extraocular movements intact.     Conjunctiva/sclera: Conjunctivae normal.     Pupils: Pupils are equal, round, and reactive to light.     Funduscopic exam:    Right eye: No papilledema.        Left eye: No papilledema.  Neck:     Thyroid: No thyroid mass.     Vascular: No carotid bruit.  Cardiovascular:     Rate and Rhythm: Normal rate and regular rhythm.     Pulses: Normal pulses.     Heart sounds: Normal heart sounds. No murmur heard. Pulmonary:     Effort: Pulmonary effort is normal. No respiratory distress.     Breath sounds: Normal breath sounds. No wheezing.  Chest:     Chest wall: No mass.  Breasts:    Tanner Score is 5.     Right: Normal. No mass or  tenderness.     Left: Normal. No mass or tenderness.  Abdominal:     General: Abdomen is flat. Bowel sounds are normal. There is no distension.     Palpations: Abdomen is soft.     Tenderness: There is no abdominal tenderness.  Genitourinary:    Comments: Deferred - followed by GYN Musculoskeletal:        General: No swelling. Normal range of motion.     Cervical back: Full passive range of motion without pain, normal range of motion and neck supple.  Right lower leg: No edema.     Left lower leg: No edema.  Lymphadenopathy:     Upper Body:     Right upper body: No supraclavicular, axillary or pectoral adenopathy.     Left upper body: No supraclavicular, axillary or pectoral adenopathy.  Skin:    General: Skin is warm and dry.     Capillary Refill: Capillary refill takes less than 2 seconds.  Neurological:     General: No focal deficit present.     Mental Status: She is alert and oriented to person, place, and time.     Cranial Nerves: No cranial nerve deficit.     Sensory: No sensory deficit.  Psychiatric:        Mood and Affect: Mood normal.        Behavior: Behavior normal.        Thought Content: Thought content normal.        Judgment: Judgment normal.         Assessment And Plan:     Encounter for annual health examination Assessment & Plan: Behavior modifications discussed and diet history reviewed.   Pt will continue to exercise regularly and modify diet with low GI, plant based foods and decrease intake of processed foods.  Recommend intake of daily multivitamin, Vitamin D, and calcium.  Recommend mammogram and colonoscopy for preventive screenings, as well as recommend immunizations that include influenza, TDAP, and Shingles (decline)    Primary hypertension Assessment & Plan: Blood pressure is fairly controlled. Continue current medications. EKG done with SR HR 71  Orders: -     EKG 12-Lead -     Microalbumin / creatinine urine ratio -     POCT  URINALYSIS DIP (CLINITEK) -     CMP14+EGFR  Mixed hyperlipidemia Assessment & Plan: Chronic, stable. Continue statin, tolerating well.   Orders: -     Lipid panel  Herpes zoster vaccination declined Assessment & Plan: Declines shingrix, educated on disease process and is aware if he changes his mind to notify office    Class 2 severe obesity due to excess calories with serious comorbidity and body mass index (BMI) of 38.0 to 38.9 in adult [facility]) Assessment & Plan: She is encouraged to strive for BMI less than 30 to decrease cardiac risk. Advised to aim for at least 150 minutes of exercise per week. She is well controlled, will continue phentermine. Weight is down approx 7 lbs.   Orders: -     Hemoglobin A1c -     Phentermine HCl; Take 1 capsule (37.5 mg total) by mouth every morning.  Dispense: 30 capsule; Refill: 1  Other long term (current) drug therapy -     CBC with Differential/Platelet  Sunburn of first degree Assessment & Plan: Peeling skin, advised to use an aloe cream/gel. Be sure to wear sunscreen when in sun even if the sunlight is not showing (cloudy days)    Return for 1 year physical, 6 month bp check. Patient was given opportunity to ask questions. Patient verbalized understanding of the plan and was able to repeat key elements of the plan. All questions were answered to their satisfaction.   Jeanell Sparrow, FNP, have reviewed all documentation for this visit. The documentation on 04/14/23 for the exam, diagnosis, procedures, and orders are all accurate and complete.

## 2023-04-15 LAB — CBC WITH DIFFERENTIAL/PLATELET: Basophils Absolute: 0 10*3/uL (ref 0.0–0.2)

## 2023-04-15 LAB — CMP14+EGFR: Globulin, Total: 2.5 g/dL (ref 1.5–4.5)

## 2023-04-22 ENCOUNTER — Ambulatory Visit: Payer: Commercial Managed Care - PPO | Admitting: Plastic Surgery

## 2023-04-22 ENCOUNTER — Encounter: Payer: Self-pay | Admitting: Plastic Surgery

## 2023-04-22 VITALS — BP 169/97 | HR 74 | Ht 65.0 in | Wt 223.0 lb

## 2023-04-22 DIAGNOSIS — M793 Panniculitis, unspecified: Secondary | ICD-10-CM

## 2023-04-22 NOTE — Progress Notes (Signed)
Patient ID: Elizabeth [name], female    DOB: 1961/04/30, 62 y.o.   MRN: [id number]   Chief Complaint  Patient presents with   Consult    The patient is a 62 year old female here for evaluation of her abdomen.  She is 5 feet 5 inches tall and weighs 223 pounds which is down from 10 more pounds several months ago.  She is not a smoker and does not have diabetes.  She is now taking a weight loss medication so she is hoping to lose more weight.  She is not at what we call her happy weight.  No sign of a hernia.  She complains of back pain and skin breakdown with rashes in her folds.    Review of Systems  Constitutional: Negative.   HENT: Negative.    Eyes: Negative.   Respiratory: Negative.    Cardiovascular: Negative.   Gastrointestinal: Negative.   Endocrine: Negative.   Genitourinary: Negative.   Musculoskeletal: Negative.     Past Medical History:  Diagnosis Date   Arthritis    ASCUS (atypical squamous cells of undetermined significance) on Pap smear 99,04, 4/10   Atrial fibrillation (HCC)    Chest pain 03/05/2013   Colon polyps    Hypertension    Shortness of breath 03/06/2013   " i had shortness of breath with my chest pain "    Past Surgical History:  Procedure Laterality Date   CARDIAC CATHETERIZATION  3/09   TONSILLECTOMY     TUBAL LIGATION        Current Outpatient Medications:    aspirin EC 81 MG tablet, Take 81 mg by mouth daily., Disp: , Rfl:    atorvastatin (LIPITOR) 20 MG tablet, Take 1 tablet (20 mg total) by mouth daily., Disp: 90 tablet, Rfl: 1   doxepin (SINEQUAN) 10 MG capsule, Take 2 capsules (20 mg total) by mouth before bedtime as needed for sleep, Disp: 180 capsule, Rfl: 3   estradiol (ESTRACE) 0.5 MG tablet, Take 1 tablet (0.5 mg total) by mouth daily., Disp: 90 tablet, Rfl: 4   lisinopril-hydrochlorothiazide (ZESTORETIC) 20-12.5 MG tablet, Take 1 tablet by mouth daily for blood pressure, Disp: 90 tablet, Rfl: 3   meloxicam (MOBIC) 15 MG tablet,  Take 1 tablet (15 mg total) by mouth once daily with food/meal, Disp: 30 tablet, Rfl: 2   methocarbamol (ROBAXIN) 500 MG tablet, Take 1 tablet (500 mg total) by mouth every 6-8 hours as needed., Disp: 40 tablet, Rfl: 2   nystatin powder, Apply 1 Application topically 3 (three) times daily., Disp: 15 g, Rfl: 0   phentermine 37.5 MG capsule, Take 1 capsule (37.5 mg total) by mouth every morning., Disp: 30 capsule, Rfl: 1   progesterone (PROMETRIUM) 100 MG capsule, Take 1 capsule (100 mg total) by mouth daily., Disp: 90 capsule, Rfl: 2   Objective:   Vitals:   04/22/23 0954  BP: (!) 169/97  Pulse: 74  SpO2: 97%    Physical Exam Vitals and nursing note reviewed.  Constitutional:      Appearance: Normal appearance.  HENT:     Head: Normocephalic and atraumatic.  Cardiovascular:     Rate and Rhythm: Normal rate.     Pulses: Normal pulses.  Pulmonary:     Effort: Pulmonary effort is normal.  Musculoskeletal:        General: No swelling.  Skin:    General: Skin is warm.     Capillary Refill: Capillary refill takes less than 2  seconds.     Coloration: Skin is not jaundiced.     Findings: No bruising.  Neurological:     Mental Status: She is alert and oriented to person, place, and time.  Psychiatric:        Mood and Affect: Mood normal.        Behavior: Behavior normal.        Thought Content: Thought content normal.        Judgment: Judgment normal.     Assessment & Plan:  Panniculitis  Patient is a good candidate for a panniculectomy.  But I recommend that she get closer to her desired weight prior to doing the surgery.  She will have a much better long-term outcome.  Especially since she is on weight loss medication.  I be happy to see her back at the end of the year.  Pictures were obtained of the patient and placed in the chart with the patient's or guardian's permission.   Alena Bills , DO

## 2023-04-24 ENCOUNTER — Encounter: Payer: Self-pay | Admitting: Nurse Practitioner

## 2023-04-24 NOTE — Assessment & Plan Note (Signed)
Peeling skin, advised to use an aloe cream/gel. Be sure to wear sunscreen when in sun even if the sunlight is not showing (cloudy days)

## 2023-04-24 NOTE — Assessment & Plan Note (Signed)
She is encouraged to strive for BMI less than 30 to decrease cardiac risk. Advised to aim for at least 150 minutes of exercise per week. She is well controlled, will continue phentermine. Weight is down approx 7 lbs.

## 2023-04-24 NOTE — Assessment & Plan Note (Signed)

## 2023-04-24 NOTE — Assessment & Plan Note (Signed)
Chronic, stable. Continue statin, tolerating well.

## 2023-04-24 NOTE — Assessment & Plan Note (Addendum)
Blood pressure is fairly controlled. Continue current medications. EKG done with SR HR 71

## 2023-04-24 NOTE — Assessment & Plan Note (Signed)
Declines shingrix, educated on disease process and is aware if he changes his mind to notify office  

## 2023-05-04 ENCOUNTER — Other Ambulatory Visit (HOSPITAL_COMMUNITY): Payer: Self-pay

## 2023-05-04 ENCOUNTER — Other Ambulatory Visit: Payer: Self-pay | Admitting: Obstetrics & Gynecology

## 2023-05-05 ENCOUNTER — Other Ambulatory Visit (HOSPITAL_COMMUNITY): Payer: Self-pay

## 2023-05-05 NOTE — Telephone Encounter (Signed)
Medication refill request: estradiol 0.5mg  tablet Last AEX:  04-23-22 Next AEX: 06-20-23 Last MMG (if hormonal medication request): [facility] is faxing result from mmg done on 01-11-23 neg per [facility]. Refill authorized: please approve if appropriate

## 2023-05-10 ENCOUNTER — Other Ambulatory Visit: Payer: Self-pay

## 2023-05-10 ENCOUNTER — Other Ambulatory Visit: Payer: Self-pay | Admitting: Nurse Practitioner

## 2023-05-10 ENCOUNTER — Other Ambulatory Visit (HOSPITAL_COMMUNITY): Payer: Self-pay

## 2023-05-10 ENCOUNTER — Encounter: Payer: Self-pay | Admitting: Nurse Practitioner

## 2023-05-10 ENCOUNTER — Encounter (HOSPITAL_COMMUNITY): Payer: Self-pay

## 2023-05-10 MED ORDER — ESTRADIOL 0.5 MG PO TABS
0.5000 mg | ORAL_TABLET | Freq: Every day | ORAL | 0 refills | Status: DC
  Filled 2023-05-10 (×2): qty 90, 90d supply, fill #0

## 2023-05-10 MED ORDER — DOXEPIN HCL 10 MG PO CAPS
20.0000 mg | ORAL_CAPSULE | Freq: Every evening | ORAL | 0 refills | Status: DC | PRN
  Filled 2023-05-10: qty 180, 90d supply, fill #0

## 2023-05-11 ENCOUNTER — Other Ambulatory Visit (HOSPITAL_COMMUNITY): Payer: Self-pay

## 2023-05-16 ENCOUNTER — Other Ambulatory Visit: Payer: Self-pay

## 2023-05-16 ENCOUNTER — Other Ambulatory Visit (HOSPITAL_COMMUNITY): Payer: Self-pay

## 2023-06-06 ENCOUNTER — Encounter: Payer: Self-pay | Admitting: Nurse Practitioner

## 2023-06-06 ENCOUNTER — Other Ambulatory Visit: Payer: Self-pay | Admitting: Nurse Practitioner

## 2023-06-06 ENCOUNTER — Other Ambulatory Visit (HOSPITAL_COMMUNITY): Payer: Self-pay

## 2023-06-06 MED ORDER — PHENTERMINE HCL 37.5 MG PO CAPS
37.5000 mg | ORAL_CAPSULE | ORAL | 0 refills | Status: DC
Start: 2023-06-06 — End: 2023-07-14
  Filled 2023-06-06 – 2023-06-13 (×2): qty 30, 30d supply, fill #0

## 2023-06-06 NOTE — Telephone Encounter (Signed)
Can you schedule her for next month weight check

## 2023-06-13 ENCOUNTER — Other Ambulatory Visit (HOSPITAL_COMMUNITY): Payer: Self-pay

## 2023-06-20 ENCOUNTER — Other Ambulatory Visit (HOSPITAL_COMMUNITY): Payer: Self-pay

## 2023-06-20 ENCOUNTER — Other Ambulatory Visit (HOSPITAL_COMMUNITY)
Admission: RE | Admit: 2023-06-20 | Discharge: 2023-06-20 | Disposition: A | Discharge status: Home / Self Care | Payer: Commercial Managed Care - PPO | Source: Ambulatory Visit | Attending: Obstetrics and Gynecology | Admitting: Obstetrics and Gynecology

## 2023-06-20 ENCOUNTER — Encounter: Payer: Self-pay | Admitting: Obstetrics and Gynecology

## 2023-06-20 ENCOUNTER — Ambulatory Visit (INDEPENDENT_AMBULATORY_CARE_PROVIDER_SITE_OTHER): Payer: Commercial Managed Care - PPO | Admitting: Obstetrics and Gynecology

## 2023-06-20 VITALS — BP 118/72 | HR 70 | Resp 16 | Ht 64.25 in | Wt 222.0 lb

## 2023-06-20 DIAGNOSIS — Z01419 Encounter for gynecological examination (general) (routine) without abnormal findings: Secondary | ICD-10-CM

## 2023-06-20 DIAGNOSIS — D219 Benign neoplasm of connective and other soft tissue, unspecified: Secondary | ICD-10-CM

## 2023-06-20 DIAGNOSIS — Z8742 Personal history of other diseases of the female genital tract: Secondary | ICD-10-CM

## 2023-06-20 DIAGNOSIS — Z1231 Encounter for screening mammogram for malignant neoplasm of breast: Secondary | ICD-10-CM

## 2023-06-20 MED ORDER — PROGESTERONE MICRONIZED 100 MG PO CAPS
100.0000 mg | ORAL_CAPSULE | Freq: Every day | ORAL | 2 refills | Status: DC
Start: 2023-06-20 — End: 2024-04-05
  Filled 2023-06-20: qty 66, 66d supply, fill #0
  Filled 2023-06-20: qty 24, 24d supply, fill #0
  Filled 2023-10-03: qty 90, 90d supply, fill #1
  Filled 2024-01-10: qty 90, 90d supply, fill #2

## 2023-06-20 MED ORDER — ESTRADIOL 0.5 MG PO TABS
0.5000 mg | ORAL_TABLET | Freq: Every day | ORAL | 0 refills | Status: DC
Start: 2023-06-20 — End: 2023-10-31
  Filled 2023-06-20 – 2023-08-04 (×2): qty 90, 90d supply, fill #0

## 2023-06-20 NOTE — Patient Instructions (Signed)
The vaginal ultrasound order has been placed, all you need to do is call the scheduling desk at [phone] to make the appointment at one of our outpatient imaging centers at your earliest convenience. This is to evaluate your uterus and ovaries.  The report will go directly to epic after.  PLEASE also call the office with the date you have the ultrasound scheduled, so we can schedule a follow-up to discuss results with you.  Dr. Karma Greaser

## 2023-06-20 NOTE — Progress Notes (Signed)
62 y.o. y.o. female here for annual exam. She denies any PM bleeding.   Patient's last menstrual period was 10/02/2013.   HPI: Postmenopause, well on HRT with Estradiol 0.5 mg tab PO daily and Prometrium 100 mg PO HS.  No PMB.  No pelvic pain.  Abstinent.  No h/o abnormal Pap.  Pap Neg 10/2019.  Will repeat Pap at 3 years.  Breasts normal. Mammo Neg 12/2021.  Urine/BMs normal.  BMI 38.49.  Lower calorie/carb.  Increase fitness activities.  Colono 03/2014. Health labs with Fam MD. Planning to have panniculectomy soon due to yeast infections under the panus  Blood pressure 118/72, height 5' 4.25" (1.632 m), weight 222 lb (100.7 kg), last menstrual period 10/02/2013.  No results found for: "DIAGPAP", "HPVHIGH", "ADEQPAP"  GYN HISTORY: No results found for: "DIAGPAP", "HPVHIGH", "ADEQPAP" Remote abnormal pap smear  OB History  Gravida Para Term Preterm AB Living  4 3 3   1 3   SAB IAB Ectopic Multiple Live Births  1            # Outcome Date GA Lbr Len/2nd Weight Sex Type Anes PTL Lv  4 SAB           3 Term           2 Term           1 Term             Past Medical History:  Diagnosis Date   Arthritis    ASCUS (atypical squamous cells of undetermined significance) on Pap smear 99,04, 4/10   Atrial fibrillation (HCC)    Chest pain 03/05/2013   Colon polyps    Hypertension    Shortness of breath 03/06/2013   " i had shortness of breath with my chest pain "    Past Surgical History:  Procedure Laterality Date   CARDIAC CATHETERIZATION  3/09   TONSILLECTOMY     TUBAL LIGATION      Current Outpatient Medications on File Prior to Visit  Medication Sig Dispense Refill   aspirin EC 81 MG tablet Take 81 mg by mouth daily.     atorvastatin (LIPITOR) 20 MG tablet Take 1 tablet (20 mg total) by mouth daily. 90 tablet 1   doxepin (SINEQUAN) 10 MG capsule Take 2 capsules (20 mg total) by mouth before bedtime as needed for sleep 180 capsule 0   estradiol (ESTRACE) 0.5 MG tablet Take  1 tablet (0.5 mg total) by mouth daily. 90 tablet 0   lisinopril-hydrochlorothiazide (ZESTORETIC) 20-12.5 MG tablet Take 1 tablet by mouth daily for blood pressure 90 tablet 3   meloxicam (MOBIC) 15 MG tablet Take 1 tablet (15 mg total) by mouth once daily with food/meal 30 tablet 2   methocarbamol (ROBAXIN) 500 MG tablet Take 1 tablet (500 mg total) by mouth every 6-8 hours as needed. 40 tablet 2   nystatin powder Apply 1 Application topically 3 (three) times daily. 15 g 0   phentermine 37.5 MG capsule Take 1 capsule (37.5 mg total) by mouth every morning. 30 capsule 0   progesterone (PROMETRIUM) 100 MG capsule Take 1 capsule (100 mg total) by mouth daily. 90 capsule 2   No current facility-administered medications on file prior to visit.    Social History   Socioeconomic History   Marital status: Single    Spouse name: Not on file   Number of children: Not on file   Years of education: Not on file  Highest education level: Some college, no degree  Occupational History   Not on file  Tobacco Use   Smoking status: Never   Smokeless tobacco: Never  Vaping Use   Vaping status: Never Used  Substance and Sexual Activity   Alcohol use: Not Currently   Drug use: No   Sexual activity: Yes    Partners: Male    Birth control/protection: Surgical    Comment: Tubal lig-1st intercourse 17 yo-5 partners  Other Topics Concern   Not on file  Social History Narrative   Not on file   Social Determinants of Health   Financial Resource Strain: Not on file  Food Insecurity: No Food Insecurity (02/23/2023)   Hunger Vital Sign    Worried About Running Out of Food in the Last Year: Never true    Ran Out of Food in the Last Year: Never true  Transportation Needs: No Transportation Needs (02/23/2023)   PRAPARE - Administrator, Civil Service (Medical): No    Lack of Transportation (Non-Medical): No  Physical Activity: Unknown (02/23/2023)   Exercise Vital Sign    Days of Exercise per  Week: 0 days    Minutes of Exercise per Session: Not on file  Stress: Stress Concern Present (02/23/2023)   [employer] of Occupational Health - Occupational Stress Questionnaire    Feeling of Stress : To some extent  Social Connections: Unknown (02/23/2023)   Social Connection and Isolation Panel [NHANES]    Frequency of Communication with Friends and Family: Not on file    Frequency of Social Gatherings with Friends and Family: Not on file    Attends Religious Services: Not on file    Active Member of Clubs or Organizations: Yes    Attends Banker Meetings: 1 to 4 times per year    Marital Status: Divorced  Catering manager Violence: Not on file    Family History  Problem Relation Age of Onset   Hypertension Mother    Diabetes Mother    Heart disease Mother    Heart disease Father    Cancer Father        stomach   Hypertension Father    Heart disease Maternal Grandmother    Hypertension Sister    Heart disease Brother    Diabetes Brother    Hypertension Brother      No Known Allergies    Patient's last menstrual period was Patient's last menstrual period was 10/02/2013.Marland Kitchen             Review of Systems Alls systems reviewed and are negative.     Physical Exam Vitals and nursing note reviewed. Exam conducted with a chaperone present.  Constitutional:      Appearance: Normal appearance.  HENT:     Head: Normocephalic.  Neck:     Thyroid: No thyroid mass, thyromegaly or thyroid tenderness.  Cardiovascular:     Rate and Rhythm: Normal rate and regular rhythm.  Pulmonary:     Effort: Pulmonary effort is normal.     Breath sounds: Normal breath sounds.  Chest:  Breasts:    Right: Normal. No swelling, bleeding, mass, nipple discharge, skin change or tenderness.     Left: Normal. No swelling, bleeding, mass, nipple discharge, skin change or tenderness.  Abdominal:     Palpations: Abdomen is soft.     Tenderness: There is no guarding or rebound.   Genitourinary:    General: Normal vulva.     Labia:  Right: No rash, tenderness or lesion.        Left: No rash, tenderness or lesion.      Urethra: No prolapse or urethral pain.     Vagina: Normal.     Cervix: Normal.     Uterus: Normal. Enlarged.      Adnexa: Right adnexa normal and left adnexa normal.  Musculoskeletal:        General: Normal range of motion.     Cervical back: Full passive range of motion without pain and normal range of motion.     Right lower leg: No edema.     Left lower leg: No edema.  Skin:    General: Skin is warm.  Neurological:     Mental Status: She is alert.       A:         Well Woman GYN exam                             P:        Pap smear collected             Encouraged annual mammogram screening                       Labs and immunizations with her primary          TV US to evaluate for fibroids Encouraged calcium and vit D Earley Favor

## 2023-06-23 LAB — CYTOLOGY - PAP
Adequacy: ABSENT
Comment: NEGATIVE
Diagnosis: NEGATIVE
High risk HPV: NEGATIVE

## 2023-07-13 ENCOUNTER — Other Ambulatory Visit: Payer: Self-pay | Admitting: Nurse Practitioner

## 2023-07-13 ENCOUNTER — Encounter: Payer: Self-pay | Admitting: Nurse Practitioner

## 2023-07-13 DIAGNOSIS — E66812 Obesity, class 2: Secondary | ICD-10-CM

## 2023-07-14 ENCOUNTER — Other Ambulatory Visit: Payer: Self-pay | Admitting: Nurse Practitioner

## 2023-07-14 ENCOUNTER — Other Ambulatory Visit: Payer: Self-pay

## 2023-07-14 ENCOUNTER — Other Ambulatory Visit (HOSPITAL_COMMUNITY): Payer: Self-pay

## 2023-07-14 MED ORDER — PHENTERMINE HCL 37.5 MG PO CAPS
37.5000 mg | ORAL_CAPSULE | ORAL | 0 refills | Status: DC
Start: 2023-07-14 — End: 2023-08-16
  Filled 2023-07-14: qty 30, 30d supply, fill #0

## 2023-07-14 MED ORDER — DOXEPIN HCL 10 MG PO CAPS
20.0000 mg | ORAL_CAPSULE | Freq: Every evening | ORAL | 0 refills | Status: DC | PRN
  Filled 2023-07-18 – 2023-08-04 (×3): qty 180, 90d supply, fill #0
  Filled ????-??-??: fill #0

## 2023-07-18 ENCOUNTER — Other Ambulatory Visit (HOSPITAL_COMMUNITY): Payer: Self-pay

## 2023-07-20 ENCOUNTER — Other Ambulatory Visit (HOSPITAL_COMMUNITY): Payer: Self-pay

## 2023-08-02 ENCOUNTER — Other Ambulatory Visit: Payer: Self-pay | Admitting: Nurse Practitioner

## 2023-08-02 ENCOUNTER — Other Ambulatory Visit (HOSPITAL_COMMUNITY): Payer: Self-pay

## 2023-08-02 DIAGNOSIS — E66812 Obesity, class 2: Secondary | ICD-10-CM

## 2023-08-03 ENCOUNTER — Encounter (HOSPITAL_COMMUNITY): Payer: Self-pay

## 2023-08-03 ENCOUNTER — Encounter: Payer: Self-pay | Admitting: Nurse Practitioner

## 2023-08-03 ENCOUNTER — Other Ambulatory Visit (HOSPITAL_COMMUNITY): Payer: Self-pay

## 2023-08-04 ENCOUNTER — Other Ambulatory Visit (HOSPITAL_COMMUNITY): Payer: Self-pay

## 2023-08-15 NOTE — Progress Notes (Unsigned)
[name], CMA,acting as a Neurosurgeon for [name], FNP.,have documented all relevant documentation on the behalf of [name], FNP,as directed by  [name], FNP while in the presence of [name], FNP.  Subjective:  Patient ID: Elizabeth [name] , female    DOB: 1961/08/08 , 62 y.o.   MRN: [id number]  No chief complaint on file.   HPI  Patient presents today for a weight check, Patient reports compliance with medication. Patient denies any chest pain, SOB, or headaches. Patient has no concerns today.     Past Medical History:  Diagnosis Date  . Arthritis   . ASCUS (atypical squamous cells of undetermined significance) on Pap smear 99,04, 4/10  . Atrial fibrillation (HCC)   . Chest pain 03/05/2013  . Colon polyps   . Hypertension   . Shortness of breath 03/06/2013   " i had shortness of breath with my chest pain "     Family History  Problem Relation Age of Onset  . Hypertension Mother   . Diabetes Mother   . Heart disease Mother   . Heart disease Father   . Cancer Father        stomach  . Hypertension Father   . Heart disease Maternal Grandmother   . Hypertension Sister   . Heart disease Brother   . Diabetes Brother   . Hypertension Brother      Current Outpatient Medications:  .  aspirin EC 81 MG tablet, Take 81 mg by mouth daily., Disp: , Rfl:  .  atorvastatin (LIPITOR) 20 MG tablet, Take 1 tablet (20 mg total) by mouth daily., Disp: 90 tablet, Rfl: 1 .  doxepin (SINEQUAN) 10 MG capsule, Take 2 capsules (20 mg total) by mouth before bedtime as needed for sleep, Disp: 180 capsule, Rfl: 0 .  estradiol (ESTRACE) 0.5 MG tablet, Take 1 tablet (0.5 mg total) by mouth daily., Disp: 90 tablet, Rfl: 0 .  lisinopril-hydrochlorothiazide (ZESTORETIC) 20-12.5 MG tablet, Take 1 tablet by mouth daily for blood pressure, Disp: 90 tablet, Rfl: 3 .  meloxicam (MOBIC) 15 MG tablet, Take 1 tablet (15 mg total) by mouth once daily with food/meal, Disp: 30 tablet, Rfl: 2 .   methocarbamol (ROBAXIN) 500 MG tablet, Take 1 tablet (500 mg total) by mouth every 6-8 hours as needed., Disp: 40 tablet, Rfl: 2 .  nystatin powder, Apply 1 Application topically 3 (three) times daily., Disp: 15 g, Rfl: 0 .  phentermine 37.5 MG capsule, Take 1 capsule (37.5 mg total) by mouth every morning., Disp: 30 capsule, Rfl: 0 .  progesterone (PROMETRIUM) 100 MG capsule, Take 1 capsule (100 mg total) by mouth daily., Disp: 90 capsule, Rfl: 2   No Known Allergies   Review of Systems   There were no vitals filed for this visit. There is no height or weight on file to calculate BMI.  Wt Readings from Last 3 Encounters:  06/20/23 222 lb (100.7 kg)  04/22/23 223 lb (101.2 kg)  04/14/23 227 lb (103 kg)    The 10-year ASCVD risk score (Arnett DK, et al., 2019) is: 5.1%   Values used to calculate the score:     Age: 28 years     Sex: Female     Is Non-Hispanic African American: Yes     Diabetic: No     Tobacco smoker: No     Systolic Blood Pressure: 118 mmHg     Is BP treated: Yes     HDL Cholesterol: 47  mg/dL     Total Cholesterol: 161 mg/dL  Objective:  Physical Exam      Assessment And Plan:  There are no diagnoses linked to this encounter.  No follow-ups on file.  Patient was given opportunity to ask questions. Patient verbalized understanding of the plan and was able to repeat key elements of the plan. All questions were answered to their satisfaction.    Jeanell Sparrow, FNP, have reviewed all documentation for this visit. The documentation on 08/15/23 for the exam, diagnosis, procedures, and orders are all accurate and complete.   IF YOU HAVE BEEN REFERRED TO A SPECIALIST, IT MAY TAKE 1-2 WEEKS TO SCHEDULE/PROCESS THE REFERRAL. IF YOU HAVE NOT HEARD FROM US/SPECIALIST IN TWO WEEKS, PLEASE GIVE [place] A CALL AT [phone].

## 2023-08-16 ENCOUNTER — Ambulatory Visit: Payer: Commercial Managed Care - PPO | Admitting: Nurse Practitioner

## 2023-08-16 ENCOUNTER — Encounter: Payer: Self-pay | Admitting: Nurse Practitioner

## 2023-08-16 ENCOUNTER — Other Ambulatory Visit (HOSPITAL_COMMUNITY): Payer: Self-pay

## 2023-08-16 VITALS — BP 100/60 | HR 68 | Temp 98.5°F | Ht 64.0 in | Wt 213.7 lb

## 2023-08-16 DIAGNOSIS — Z6838 Body mass index (BMI) 38.0-38.9, adult: Secondary | ICD-10-CM | POA: Diagnosis not present

## 2023-08-16 DIAGNOSIS — Z2821 Immunization not carried out because of patient refusal: Secondary | ICD-10-CM | POA: Diagnosis not present

## 2023-08-16 DIAGNOSIS — E66812 Obesity, class 2: Secondary | ICD-10-CM | POA: Diagnosis not present

## 2023-08-16 DIAGNOSIS — I10 Essential (primary) hypertension: Secondary | ICD-10-CM

## 2023-08-16 MED ORDER — PHENTERMINE HCL 37.5 MG PO CAPS
37.5000 mg | ORAL_CAPSULE | ORAL | 1 refills | Status: DC
Start: 2023-08-16 — End: 2023-10-13
  Filled 2023-08-16: qty 30, 30d supply, fill #0
  Filled 2023-09-10 – 2023-09-13 (×3): qty 30, 30d supply, fill #1

## 2023-08-16 NOTE — Assessment & Plan Note (Addendum)
She is encouraged to strive for BMI less than 30 to decrease cardiac risk. Advised to aim for at least 150 minutes of exercise per week. She is well controlled, will continue phentermine. Weight is down approx 14 lbs since July. Continue phentermine doing well. We discussed incorporating more activity even if walking and strength training

## 2023-08-16 NOTE — Assessment & Plan Note (Signed)
Declines shingrix, educated on disease process and is aware if he changes his mind to notify office  

## 2023-08-16 NOTE — Assessment & Plan Note (Signed)
Blood pressure is well controlled. Continue current medications. 

## 2023-08-16 NOTE — Assessment & Plan Note (Signed)

## 2023-08-16 NOTE — Patient Instructions (Addendum)
Qsymia would be an option when needing to the phentermine alone.  Goal to exercise 150 minutes per week with at least 2 days of strength training with light weights or resistance bands Encouraged to park further when at the store, take stairs instead of elevators and to walk in place during commercials. Increase water intake to at least one gallon of water daily.

## 2023-08-23 ENCOUNTER — Ambulatory Visit: Payer: Commercial Managed Care - PPO | Admitting: Plastic Surgery

## 2023-09-12 ENCOUNTER — Other Ambulatory Visit: Payer: Self-pay

## 2023-09-12 ENCOUNTER — Other Ambulatory Visit: Payer: Self-pay | Admitting: Nurse Practitioner

## 2023-09-12 ENCOUNTER — Other Ambulatory Visit (HOSPITAL_COMMUNITY): Payer: Self-pay

## 2023-09-12 MED ORDER — ATORVASTATIN CALCIUM 20 MG PO TABS
20.0000 mg | ORAL_TABLET | Freq: Every day | ORAL | 1 refills | Status: DC
  Filled 2023-09-12: qty 90, 90d supply, fill #0
  Filled 2024-01-06: qty 90, 90d supply, fill #1

## 2023-09-13 ENCOUNTER — Other Ambulatory Visit (HOSPITAL_COMMUNITY): Payer: Self-pay

## 2023-09-13 ENCOUNTER — Other Ambulatory Visit: Payer: Self-pay

## 2023-10-05 DIAGNOSIS — H40013 Open angle with borderline findings, low risk, bilateral: Secondary | ICD-10-CM | POA: Diagnosis not present

## 2023-10-11 ENCOUNTER — Encounter: Payer: Self-pay | Admitting: Plastic Surgery

## 2023-10-11 ENCOUNTER — Ambulatory Visit: Payer: Commercial Managed Care - PPO | Admitting: Plastic Surgery

## 2023-10-11 VITALS — BP 159/89 | HR 67 | Ht 65.0 in | Wt 216.8 lb

## 2023-10-11 DIAGNOSIS — M793 Panniculitis, unspecified: Secondary | ICD-10-CM | POA: Diagnosis not present

## 2023-10-11 NOTE — Progress Notes (Signed)
   Subjective:    Patient ID: Elizabeth [name], female    DOB: November 13, 1960, 63 y.o.   MRN: [id number]  The patient is a 63 year old female here for further evaluation of her abdomen.  She is 5 feet 5 inches tall and weighs 216 pounds she has been able to decrease her weight about 15 pounds from her last visit she is not a smoker and does not have diabetes.  She has started weight loss medications and was hoping that this would make a difference.  She complains of back pain, neck pain skin breakdown and rashes.      Review of Systems  Constitutional:  Positive for activity change. Negative for appetite change.  HENT: Negative.    Eyes: Negative.   Respiratory: Negative.  Negative for chest tightness and shortness of breath.   Cardiovascular: Negative.   Gastrointestinal: Negative.   Endocrine: Negative.   Genitourinary: Negative.   Musculoskeletal:  Positive for back pain and neck pain.  Skin:  Positive for rash.       Objective:   Physical Exam Vitals and nursing note reviewed.  Constitutional:      Appearance: Normal appearance.  HENT:     Head: Atraumatic.  Cardiovascular:     Rate and Rhythm: Normal rate.     Pulses: Normal pulses.  Pulmonary:     Effort: Pulmonary effort is normal.  Abdominal:     Palpations: Abdomen is soft.  Musculoskeletal:        General: No swelling or deformity.  Skin:    General: Skin is warm.     Capillary Refill: Capillary refill takes less than 2 seconds.     Findings: No bruising.  Neurological:     Mental Status: She is alert and oriented to person, place, and time.  Psychiatric:        Mood and Affect: Mood normal.        Behavior: Behavior normal.        Thought Content: Thought content normal.       Assessment & Plan:     ICD-10-CM   1. Panniculitis  M79.3        The patient is a candidate for abdominoplasty and panniculectomy but I do recommend further weight loss prior to the surgery so she will have a better long-term  result we are going to give a referral to the healthy weight and wellness center and then she will come see [place] back in 6 months. Pictures were obtained of the patient and placed in the chart with the patient's or guardian's permission.

## 2023-10-13 ENCOUNTER — Encounter: Payer: Self-pay | Admitting: Nurse Practitioner

## 2023-10-13 ENCOUNTER — Other Ambulatory Visit: Payer: Self-pay | Admitting: Nurse Practitioner

## 2023-10-13 ENCOUNTER — Other Ambulatory Visit (HOSPITAL_COMMUNITY): Payer: Self-pay

## 2023-10-13 MED ORDER — PHENTERMINE HCL 37.5 MG PO CAPS
37.5000 mg | ORAL_CAPSULE | ORAL | 1 refills | Status: DC
Start: 2023-10-13 — End: 2023-12-08
  Filled 2023-10-13: qty 30, 30d supply, fill #0
  Filled 2023-11-18: qty 30, 30d supply, fill #1

## 2023-10-17 ENCOUNTER — Ambulatory Visit: Payer: Commercial Managed Care - PPO | Admitting: Nurse Practitioner

## 2023-10-17 ENCOUNTER — Other Ambulatory Visit (HOSPITAL_COMMUNITY): Payer: Self-pay

## 2023-10-17 ENCOUNTER — Encounter: Payer: Self-pay | Admitting: Nurse Practitioner

## 2023-10-17 VITALS — BP 130/74 | HR 69 | Temp 98.2°F | Ht 65.0 in | Wt 214.3 lb

## 2023-10-17 DIAGNOSIS — Z2821 Immunization not carried out because of patient refusal: Secondary | ICD-10-CM

## 2023-10-17 DIAGNOSIS — I10 Essential (primary) hypertension: Secondary | ICD-10-CM | POA: Diagnosis not present

## 2023-10-17 DIAGNOSIS — Z6835 Body mass index (BMI) 35.0-35.9, adult: Secondary | ICD-10-CM

## 2023-10-17 DIAGNOSIS — E66812 Obesity, class 2: Secondary | ICD-10-CM

## 2023-10-17 DIAGNOSIS — F5101 Primary insomnia: Secondary | ICD-10-CM

## 2023-10-17 DIAGNOSIS — E782 Mixed hyperlipidemia: Secondary | ICD-10-CM | POA: Diagnosis not present

## 2023-10-17 MED ORDER — DOXEPIN HCL 25 MG PO CAPS
25.0000 mg | ORAL_CAPSULE | Freq: Every evening | ORAL | 1 refills | Status: DC | PRN
  Filled 2023-10-17: qty 90, 90d supply, fill #0
  Filled 2024-01-29: qty 90, 90d supply, fill #1

## 2023-10-17 MED ORDER — WEGOVY 0.5 MG/0.5ML ~~LOC~~ SOAJ: 0.5000 mg | SUBCUTANEOUS | 0 refills | Status: DC

## 2023-10-17 NOTE — Progress Notes (Signed)
Elizabeth [name], CMA,acting as a Neurosurgeon for Elizabeth Felts, FNP.,have documented all relevant documentation on the behalf of Elizabeth Felts, FNP,as directed by  Elizabeth Felts, FNP while in the presence of Elizabeth Felts, FNP.  Subjective:  Patient ID: Elizabeth [name] , female    DOB: 10/26/1960 , 63 y.o.   MRN: [id number]  Chief Complaint  Patient presents with   Obesity    HPI  Patient presents today for a weight follow up, Patient reports compliance with medication. Patient denies any chest pain, SOB, or headaches. Patient has no concerns today. She seen Dr. Ulice Bold - she is to lose more weight and pushed her surgery back to June. She is walking some extra days. She feels like her weight is at a plateau. Patient declines history of atrial fibrillation     Past Medical History:  Diagnosis Date   Arthritis    ASCUS (atypical squamous cells of undetermined significance) on Pap smear 99,04, 4/10   Atrial fibrillation (HCC)    Chest pain 03/05/2013   Colon polyps    Hypertension    Shortness of breath 03/06/2013   " i had shortness of breath with my chest pain "     Family History  Problem Relation Age of Onset   Hypertension Mother    Diabetes Mother    Heart disease Mother    Heart disease Father    Cancer Father        stomach   Hypertension Father    Heart disease Maternal Grandmother    Hypertension Sister    Heart disease Brother    Diabetes Brother    Hypertension Brother      Current Outpatient Medications:    aspirin EC 81 MG tablet, Take 81 mg by mouth daily., Disp: , Rfl:    atorvastatin (LIPITOR) 20 MG tablet, Take 1 tablet (20 mg total) by mouth daily., Disp: 90 tablet, Rfl: 1   estradiol (ESTRACE) 0.5 MG tablet, Take 1 tablet (0.5 mg total) by mouth daily., Disp: 90 tablet, Rfl: 0   lisinopril-hydrochlorothiazide (ZESTORETIC) 20-12.5 MG tablet, Take 1 tablet by mouth daily for blood pressure, Disp: 90 tablet, Rfl: 3   meloxicam (MOBIC) 15 MG tablet, Take 1 tablet  (15 mg total) by mouth once daily with food/meal, Disp: 30 tablet, Rfl: 2   methocarbamol (ROBAXIN) 500 MG tablet, Take 1 tablet (500 mg total) by mouth every 6-8 hours as needed., Disp: 40 tablet, Rfl: 2   nystatin powder, Apply 1 Application topically 3 (three) times daily., Disp: 15 g, Rfl: 0   phentermine 37.5 MG capsule, Take 1 capsule (37.5 mg total) by mouth every morning., Disp: 30 capsule, Rfl: 1   progesterone (PROMETRIUM) 100 MG capsule, Take 1 capsule (100 mg total) by mouth daily., Disp: 90 capsule, Rfl: 2   Semaglutide-Weight Management (WEGOVY) 0.5 MG/0.5ML SOAJ, Inject 0.5 mg into the skin once a week., Disp: 6 mL, Rfl: 0   doxepin (SINEQUAN) 25 MG capsule, Take 1 capsule (25 mg total) by mouth at bedtime as needed., Disp: 90 capsule, Rfl: 1   No Known Allergies   Review of Systems  Constitutional: Negative.   Respiratory: Negative.    Cardiovascular: Negative.   Musculoskeletal: Negative.   Skin:  Negative for rash.  Neurological: Negative.   Psychiatric/Behavioral: Negative.       Today's Vitals   10/17/23 1546  BP: 130/74  Pulse: 69  Temp: 98.2 F (36.8 C)  TempSrc: Oral  Weight: 214 lb 4.8 oz (  97.2 kg)  Height: 5\' 5"  (1.651 m)  PainSc: 0-No pain   Body mass index is 35.66 kg/m.  Wt Readings from Last 3 Encounters:  10/17/23 214 lb 4.8 oz (97.2 kg)  10/11/23 216 lb 12.8 oz (98.3 kg)  08/16/23 213 lb 11.2 oz (96.9 kg)    Objective:  Physical Exam Vitals reviewed.  Constitutional:      General: She is not in acute distress.    Appearance: Normal appearance. She is well-developed. She is obese.  HENT:     Head: Normocephalic and atraumatic.  Eyes:     Pupils: Pupils are equal, round, and reactive to light.  Cardiovascular:     Rate and Rhythm: Normal rate and regular rhythm.     Pulses: Normal pulses.     Heart sounds: Normal heart sounds. No murmur heard. Pulmonary:     Effort: Pulmonary effort is normal. No respiratory distress.     Breath  sounds: Normal breath sounds. No wheezing.  Musculoskeletal:        General: Normal range of motion.  Skin:    General: Skin is warm and dry.     Capillary Refill: Capillary refill takes less than 2 seconds.  Neurological:     General: No focal deficit present.     Mental Status: She is alert and oriented to person, place, and time.     Cranial Nerves: No cranial nerve deficit.     Motor: No weakness.  Psychiatric:        Mood and Affect: Mood normal.        Behavior: Behavior normal.        Thought Content: Thought content normal.        Judgment: Judgment normal.         Assessment And Plan:  Class 2 severe obesity due to excess calories with serious comorbidity and body mass index (BMI) of 35.0 to 35.9 in adult [facility]) Assessment & Plan: Continue phentermine, will try to get her approved for [facility] through mail order. She is encouraged to strive for BMI less than 30 to decrease cardiac risk. Advised to aim for at least 150 minutes of exercise per week.   Orders: -     Wegovy; Inject 0.5 mg into the skin once a week.  Dispense: 6 mL; Refill: 0  Primary hypertension Assessment & Plan: Blood pressure is well controlled. Continue current medications.    Mixed hyperlipidemia Assessment & Plan: Chronic, stable. Continue statin, tolerating well.    Primary insomnia Assessment & Plan: Continue doxepin, doing well.   Orders: -     Doxepin HCl; Take 1 capsule (25 mg total) by mouth at bedtime as needed.  Dispense: 90 capsule; Refill: 1  COVID-19 vaccination declined Assessment & Plan: Declines covid 19 vaccine. Discussed risk of covid 60 and if she changes her mind about the vaccine to call the office. Education has been provided regarding the importance of this vaccine but patient still declined. Advised may receive this vaccine at local pharmacy or Health Dept.or vaccine clinic. Aware to provide a copy of the vaccination record if obtained from local pharmacy or Health Dept.   Encouraged to take multivitamin, vitamin d, vitamin c and zinc to increase immune system. Aware can call office if would like to have vaccine here at office. Verbalized acceptance and understanding.      Return for 2 months weight check.  Patient was given opportunity to ask questions. Patient verbalized understanding of the plan and was able  to repeat key elements of the plan. All questions were answered to their satisfaction.    Jeanell Sparrow, FNP, have reviewed all documentation for this visit. The documentation on 10/17/23 for the exam, diagnosis, procedures, and orders are all accurate and complete.   IF YOU HAVE BEEN REFERRED TO A SPECIALIST, IT MAY TAKE 1-2 WEEKS TO SCHEDULE/PROCESS THE REFERRAL. IF YOU HAVE NOT HEARD FROM US/SPECIALIST IN TWO WEEKS, PLEASE GIVE [place] A CALL AT [phone].

## 2023-10-17 NOTE — Patient Instructions (Addendum)
[facility] - [phone]  Take your phentermine every other day for one week then every 2 days for one week.

## 2023-10-23 DIAGNOSIS — E66812 Obesity, class 2: Secondary | ICD-10-CM | POA: Insufficient documentation

## 2023-10-23 NOTE — Assessment & Plan Note (Signed)
Continue doxepin, doing well

## 2023-10-23 NOTE — Assessment & Plan Note (Signed)
Continue phentermine, will try to get her approved for [facility] through mail order. She is encouraged to strive for BMI less than 30 to decrease cardiac risk. Advised to aim for at least 150 minutes of exercise per week.

## 2023-10-23 NOTE — Assessment & Plan Note (Signed)
Blood pressure is well controlled. Continue current medications. 

## 2023-10-23 NOTE — Assessment & Plan Note (Signed)

## 2023-10-23 NOTE — Assessment & Plan Note (Signed)
Chronic, stable. Continue statin, tolerating well.

## 2023-10-31 ENCOUNTER — Other Ambulatory Visit: Payer: Self-pay | Admitting: Obstetrics and Gynecology

## 2023-11-01 ENCOUNTER — Other Ambulatory Visit: Payer: Self-pay | Admitting: Nurse Practitioner

## 2023-11-01 ENCOUNTER — Other Ambulatory Visit (HOSPITAL_COMMUNITY): Payer: Self-pay

## 2023-11-01 MED ORDER — ESTRADIOL 0.5 MG PO TABS
0.5000 mg | ORAL_TABLET | Freq: Every day | ORAL | 0 refills | Status: DC
  Filled 2023-11-01: qty 90, 90d supply, fill #0

## 2023-11-01 NOTE — Telephone Encounter (Signed)
Med refill request: Estradiol 0.5mg  tablet  Last AEX: 06/20/23 Next AEX: not scheduled  Last MMG (if hormonal med) 01/11/23 [facility] cat 1 neg  Refill authorized: Last rx 06/20/23 #90 with 0 refills. Please approve or deny as appropriate.

## 2023-11-02 MED ORDER — WEGOVY 0.5 MG/0.5ML ~~LOC~~ SOAJ: 0.5000 mg | SUBCUTANEOUS | 0 refills | Status: DC

## 2023-11-07 ENCOUNTER — Encounter: Payer: Self-pay | Admitting: Nurse Practitioner

## 2023-11-18 ENCOUNTER — Other Ambulatory Visit: Payer: Self-pay

## 2023-11-23 ENCOUNTER — Encounter (INDEPENDENT_AMBULATORY_CARE_PROVIDER_SITE_OTHER): Payer: Self-pay

## 2023-12-08 ENCOUNTER — Ambulatory Visit: Admitting: Nurse Practitioner

## 2023-12-08 ENCOUNTER — Encounter: Payer: Self-pay | Admitting: Nurse Practitioner

## 2023-12-08 VITALS — BP 130/78 | HR 98 | Temp 98.8°F | Ht 65.0 in | Wt 210.0 lb

## 2023-12-08 DIAGNOSIS — E66811 Obesity, class 1: Secondary | ICD-10-CM | POA: Diagnosis not present

## 2023-12-08 DIAGNOSIS — Z6834 Body mass index (BMI) 34.0-34.9, adult: Secondary | ICD-10-CM

## 2023-12-08 DIAGNOSIS — E6609 Other obesity due to excess calories: Secondary | ICD-10-CM

## 2023-12-08 DIAGNOSIS — Z2821 Immunization not carried out because of patient refusal: Secondary | ICD-10-CM | POA: Diagnosis not present

## 2023-12-08 MED ORDER — QSYMIA 7.5-46 MG PO CP24: 1.0000 | ORAL_CAPSULE | Freq: Every day | ORAL | 1 refills | Status: DC

## 2023-12-08 MED ORDER — QSYMIA 3.75-23 MG PO CP24: 1.0000 | ORAL_CAPSULE | Freq: Every day | ORAL | 0 refills | Status: DC

## 2023-12-08 NOTE — Progress Notes (Signed)
 [name], CMA,acting as a Neurosurgeon for [name], FNP.,have documented all relevant documentation on the behalf of [name], FNP,as directed by  [name], FNP while in the presence of [name], FNP.  Subjective:  Patient ID: Elizabeth [name] , female    DOB: 04-27-1961 , 63 y.o.   MRN: [id number]  Chief Complaint  Patient presents with   Obesity    HPI  Patient presents today for a weight follow up, Patient reports compliance with medication. Patient denies any chest pain, SOB, or headaches. Patient has no concerns today. Patient is currently on ozempic.  Wants to loose weight for paniculectomy surgery.  Has lost 25 pounds so far and feels better.  Her goal is better overall healthy.    Patient is not taking phentermine, stopped a month ago.  Started ozempic at last visit.  Says that its working great, she has had results, she has one sample yet.  States that she is concerned about going to health weight and wellness due to her schedule at work.  She is exercising more, walking.  Tries to eat more clean food and baked foods, less sugary fruits, apples and berries.  Water intake has not been as good, has tried lemon and flavor packets to increase water intake.     Past Medical History:  Diagnosis Date   Arthritis    ASCUS (atypical squamous cells of undetermined significance) on Pap smear 99,04, 4/10   Atrial fibrillation (HCC)    Chest pain 03/05/2013   Colon polyps    Hypertension    Shortness of breath 03/06/2013   " i had shortness of breath with my chest pain "     Family History  Problem Relation Age of Onset   Hypertension Mother    Diabetes Mother    Heart disease Mother    Heart disease Father    Cancer Father        stomach   Hypertension Father    Heart disease Maternal Grandmother    Hypertension Sister    Heart disease Brother    Diabetes Brother    Hypertension Brother      Current Outpatient Medications:    aspirin EC 81 MG tablet, Take 81  mg by mouth daily., Disp: , Rfl:    atorvastatin (LIPITOR) 20 MG tablet, Take 1 tablet (20 mg total) by mouth daily., Disp: 90 tablet, Rfl: 1   doxepin (SINEQUAN) 25 MG capsule, Take 1 capsule (25 mg total) by mouth at bedtime as needed., Disp: 90 capsule, Rfl: 1   estradiol (ESTRACE) 0.5 MG tablet, Take 1 tablet (0.5 mg total) by mouth daily., Disp: 90 tablet, Rfl: 0   lisinopril-hydrochlorothiazide (ZESTORETIC) 20-12.5 MG tablet, Take 1 tablet by mouth daily for blood pressure, Disp: 90 tablet, Rfl: 3   meloxicam (MOBIC) 15 MG tablet, Take 1 tablet (15 mg total) by mouth once daily with food/meal, Disp: 30 tablet, Rfl: 2   methocarbamol (ROBAXIN) 500 MG tablet, Take 1 tablet (500 mg total) by mouth every 6-8 hours as needed., Disp: 40 tablet, Rfl: 2   nystatin powder, Apply 1 Application topically 3 (three) times daily., Disp: 15 g, Rfl: 0   Phentermine-Topiramate (QSYMIA) 3.75-23 MG CP24, Take 1 capsule by mouth daily., Disp: 14 capsule, Rfl: 0   Phentermine-Topiramate (QSYMIA) 7.5-46 MG CP24, Take 1 capsule by mouth daily., Disp: 30 capsule, Rfl: 1   progesterone (PROMETRIUM) 100 MG capsule, Take 1 capsule (100 mg total) by mouth daily., Disp: 90  capsule, Rfl: 2   No Known Allergies   Review of Systems  Constitutional: Negative.   Respiratory: Negative.    Cardiovascular: Negative.   Musculoskeletal: Negative.   Skin:  Negative for rash.  Neurological: Negative.   Psychiatric/Behavioral: Negative.    All other systems reviewed and are negative.    Today's Vitals   12/08/23 1602  BP: 130/78  Pulse: 98  Temp: 98.8 F (37.1 C)  TempSrc: Oral  Weight: 210 lb (95.3 kg)  Height: 5\' 5"  (1.651 m)  PainSc: 0-No pain   Body mass index is 34.95 kg/m.  Wt Readings from Last 3 Encounters:  12/08/23 210 lb (95.3 kg)  10/17/23 214 lb 4.8 oz (97.2 kg)  10/11/23 216 lb 12.8 oz (98.3 kg)     Objective:  Physical Exam Vitals and nursing note reviewed.  Constitutional:      General:  She is not in acute distress.    Appearance: Normal appearance. She is obese.  HENT:     Head: Normocephalic and atraumatic.     Right Ear: External ear normal.     Left Ear: External ear normal.     Nose: Nose normal.     Mouth/Throat:     Mouth: Mucous membranes are moist.     Pharynx: Oropharynx is clear.  Eyes:     Pupils: Pupils are equal, round, and reactive to light.  Cardiovascular:     Rate and Rhythm: Normal rate and regular rhythm.     Pulses: Normal pulses.     Heart sounds: Normal heart sounds.  Pulmonary:     Effort: Pulmonary effort is normal.     Breath sounds: Normal breath sounds.  Musculoskeletal:        General: Normal range of motion.     Cervical back: Normal range of motion.  Skin:    General: Skin is warm and dry.     Capillary Refill: Capillary refill takes 2 to 3 seconds.  Neurological:     General: No focal deficit present.     Mental Status: She is alert and oriented to person, place, and time. Mental status is at baseline.  Psychiatric:        Mood and Affect: Mood normal.        Behavior: Behavior normal.        Thought Content: Thought content normal.        Judgment: Judgment normal.         Assessment And Plan:  COVID-19 vaccination declined Assessment & Plan: Declines covid 19 vaccine. Discussed risk of covid 21 and if she changes her mind about the vaccine to call the office. Education has been provided regarding the importance of this vaccine but patient still declined. Advised may receive this vaccine at local pharmacy or Health Dept.or vaccine clinic. Aware to provide a copy of the vaccination record if obtained from local pharmacy or Health Dept.  Encouraged to take multivitamin, vitamin d, vitamin c and zinc to increase immune system. Aware can call office if would like to have vaccine here at office. Verbalized acceptance and understanding.    Class 1 obesity due to excess calories without serious comorbidity with body mass index  (BMI) of 34.0 to 34.9 in adult Assessment & Plan: Insurance is not covering GLP1s, will start her on Qsymia via mail order. Will f/u in 2 months. Encouraged to increase physical activity as best as possible.  Orders: -     Qsymia; Take 1 capsule by mouth daily.  Dispense: 14 capsule; Refill: 0 -     Qsymia; Take 1 capsule by mouth daily.  Dispense: 30 capsule; Refill: 1  Herpes zoster vaccination declined Assessment & Plan: Declines shingrix, educated on disease process and is aware if he changes his mind to notify office      Return for 10 weeks weight check.  Patient was given opportunity to ask questions. Patient verbalized understanding of the plan and was able to repeat key elements of the plan. All questions were answered to their satisfaction.    Jeanell Sparrow, FNP, have reviewed all documentation for this visit. The documentation on 12/08/23 for the exam, diagnosis, procedures, and orders are all accurate and complete.   IF YOU HAVE BEEN REFERRED TO A SPECIALIST, IT MAY TAKE 1-2 WEEKS TO SCHEDULE/PROCESS THE REFERRAL. IF YOU HAVE NOT HEARD FROM US/SPECIALIST IN TWO WEEKS, PLEASE GIVE [place] A CALL AT [phone] X [phone].

## 2023-12-08 NOTE — Patient Instructions (Addendum)
 Increase physical activity to 4 days a week, 30 minutes a day of walking without stopping. Continue to avoid sweet fruits, eat apples and berries, avoid fried foods, eat baked, broiled, grilled meats and vegetables.   Increase water intake to a minimum of 64 oz per day. I encourage you to park further when at the store, take stairs instead of elevators and to walk in place during commercials. Increase water intake to at least one gallon of water daily. Suggestion is made for her to avoid simple carbohydrates from her diet including Cakes, Sweet Desserts, Ice Cream, Soda (diet and regular), Sweet Tea, Candies, Chips, Cookies, Store Bought Juices, Alcohol in Excess of 1-2 drinks a day, Artificial Sweeteners, Coffee Creamer, and "Sugar-free" Products. This will help patient to have more stable blood glucose

## 2023-12-12 ENCOUNTER — Ambulatory Visit: Admitting: Nurse Practitioner

## 2023-12-13 ENCOUNTER — Ambulatory Visit: Payer: Commercial Managed Care - PPO | Admitting: Nurse Practitioner

## 2023-12-20 DIAGNOSIS — E6609 Other obesity due to excess calories: Secondary | ICD-10-CM | POA: Insufficient documentation

## 2023-12-20 NOTE — Assessment & Plan Note (Signed)
 Declines shingrix, educated on disease process and is aware if he changes his mind to notify office

## 2023-12-20 NOTE — Assessment & Plan Note (Signed)
 Insurance is not covering GLP1s, will start her on Qsymia via mail order. Will f/u in 2 months. Encouraged to increase physical activity as best as possible.

## 2023-12-20 NOTE — Assessment & Plan Note (Signed)

## 2024-01-02 ENCOUNTER — Encounter: Payer: Self-pay | Admitting: Plastic Surgery

## 2024-01-02 NOTE — Telephone Encounter (Signed)
 Will you please schedule this patient a consult with Dr. Orin Birk to discuss torn earlobe repair?

## 2024-01-17 DIAGNOSIS — Z1231 Encounter for screening mammogram for malignant neoplasm of breast: Secondary | ICD-10-CM | POA: Diagnosis not present

## 2024-01-17 LAB — HM MAMMOGRAPHY

## 2024-01-19 ENCOUNTER — Encounter: Payer: Self-pay | Admitting: Internal Medicine

## 2024-01-27 ENCOUNTER — Encounter: Payer: Self-pay | Admitting: Plastic Surgery

## 2024-01-27 ENCOUNTER — Ambulatory Visit (INDEPENDENT_AMBULATORY_CARE_PROVIDER_SITE_OTHER): Payer: Self-pay | Admitting: Plastic Surgery

## 2024-01-27 VITALS — BP 167/92 | HR 62

## 2024-01-27 DIAGNOSIS — S01319A Laceration without foreign body of unspecified ear, initial encounter: Secondary | ICD-10-CM | POA: Insufficient documentation

## 2024-01-27 DIAGNOSIS — Z719 Counseling, unspecified: Secondary | ICD-10-CM

## 2024-01-27 DIAGNOSIS — S01311A Laceration without foreign body of right ear, initial encounter: Secondary | ICD-10-CM

## 2024-01-27 NOTE — Progress Notes (Signed)
   Subjective:    Patient ID: Elizabeth [name], female    DOB: Apr 25, 1961, 63 y.o.   MRN: [id number]  The patient is a 63 year old female here for evaluation of her right earlobe.  The patient states that she has noticed it tearing over the past few years.  It was bleeding the other day so she knows it tore a little bit more.  She is unable to wear earrings as they come out in that area.  She does have at least 3 piercings on both ears.  She would like it repaired.  There is no sign of infection at the moment.    Review of Systems  Constitutional: Negative.   Eyes: Negative.   Respiratory: Negative.    Cardiovascular: Negative.   Gastrointestinal: Negative.   Genitourinary: Negative.   Musculoskeletal: Negative.        Objective:   Physical Exam Vitals reviewed.  HENT:     Head: Atraumatic.     Ears:   Cardiovascular:     Rate and Rhythm: Normal rate.     Pulses: Normal pulses.  Skin:    Capillary Refill: Capillary refill takes less than 2 seconds.  Neurological:     Mental Status: She is oriented to person, place, and time.  Psychiatric:        Mood and Affect: Mood normal.        Behavior: Behavior normal.        Thought Content: Thought content normal.        Judgment: Judgment normal.         Assessment & Plan:     ICD-10-CM   1. Torn right ear lobe, initial encounter  S01.311A        Repair of right torn earlobe with Z-plasty in office.  Pictures were obtained of the patient and placed in the chart with the patient's or guardian's permission.

## 2024-01-29 ENCOUNTER — Telehealth: Payer: Self-pay | Admitting: Obstetrics and Gynecology

## 2024-01-30 ENCOUNTER — Other Ambulatory Visit (HOSPITAL_COMMUNITY): Payer: Self-pay

## 2024-01-30 ENCOUNTER — Other Ambulatory Visit: Payer: Self-pay

## 2024-01-30 MED ORDER — ESTRADIOL 0.5 MG PO TABS
0.5000 mg | ORAL_TABLET | Freq: Every day | ORAL | 0 refills | Status: DC
  Filled 2024-01-30: qty 90, 90d supply, fill #0

## 2024-01-30 NOTE — Telephone Encounter (Signed)
 Medication refill request: estradiol  tab  Last AEX:  06/20/23 Next AEX: not scheduled  Last MMG (if hormonal medication request): 01/11/23 bi-rads 1 neg  Refill authorized: please advise

## 2024-01-31 ENCOUNTER — Other Ambulatory Visit (HOSPITAL_COMMUNITY): Payer: Self-pay

## 2024-01-31 ENCOUNTER — Other Ambulatory Visit: Payer: Self-pay

## 2024-02-01 ENCOUNTER — Other Ambulatory Visit (HOSPITAL_COMMUNITY): Payer: Self-pay

## 2024-02-01 NOTE — Telephone Encounter (Signed)
 Copied from CRM [id number]. Topic: Clinical - Prescription Issue >> Feb 01, 2024 10:04 AM Kevelyn M wrote: Reason for CRM: Margretta Shi with [facility] at [facility] stated that patient is requesting for meds Phentermine -Topiramate (QSYMIA ) 3.75-23 MG  Phentermine -Topiramate (QSYMIA ) 7.5-46 MG, in whichever strength that was sent to [facility], to be sent to: [facility] at [facility] [address], [place], [place] [address]. Please call [facility] with any questions [phone].

## 2024-02-02 ENCOUNTER — Other Ambulatory Visit (HOSPITAL_COMMUNITY): Payer: Self-pay

## 2024-02-02 ENCOUNTER — Other Ambulatory Visit: Payer: Self-pay | Admitting: Nurse Practitioner

## 2024-02-02 DIAGNOSIS — E66811 Obesity, class 1: Secondary | ICD-10-CM

## 2024-02-02 MED ORDER — QSYMIA 7.5-46 MG PO CP24
1.0000 | ORAL_CAPSULE | Freq: Every day | ORAL | 1 refills | Status: DC
  Filled 2024-02-02 – 2024-02-03 (×2): qty 30, 30d supply, fill #0

## 2024-02-03 ENCOUNTER — Other Ambulatory Visit (HOSPITAL_COMMUNITY): Payer: Self-pay

## 2024-02-20 ENCOUNTER — Encounter: Payer: Self-pay | Admitting: Nurse Practitioner

## 2024-02-20 ENCOUNTER — Ambulatory Visit: Admitting: Nurse Practitioner

## 2024-02-20 VITALS — BP 120/78 | HR 68 | Temp 98.5°F | Ht 65.0 in | Wt 217.6 lb

## 2024-02-20 DIAGNOSIS — Z6836 Body mass index (BMI) 36.0-36.9, adult: Secondary | ICD-10-CM

## 2024-02-20 DIAGNOSIS — Z2821 Immunization not carried out because of patient refusal: Secondary | ICD-10-CM

## 2024-02-20 DIAGNOSIS — E6609 Other obesity due to excess calories: Secondary | ICD-10-CM | POA: Diagnosis not present

## 2024-02-20 DIAGNOSIS — E66812 Obesity, class 2: Secondary | ICD-10-CM | POA: Diagnosis not present

## 2024-02-20 MED ORDER — PHENTERMINE-TOPIRAMATE ER 3.75-23 MG PO CP24: 1.0000 | ORAL_CAPSULE | Freq: Every day | ORAL | 0 refills | Status: DC

## 2024-02-20 MED ORDER — PHENTERMINE-TOPIRAMATE ER 7.5-46 MG PO CP24: 1.0000 | ORAL_CAPSULE | Freq: Every day | ORAL | 1 refills | Status: DC

## 2024-02-20 MED ORDER — PHENTERMINE-TOPIRAMATE 7.5-46 MG PO CP24
1.0000 | ORAL_CAPSULE | Freq: Every day | ORAL | 1 refills | Status: DC
Start: 2024-02-20 — End: 2024-02-20

## 2024-02-20 NOTE — Patient Instructions (Signed)
 Call [facility] about the Qsymia  [phone]

## 2024-02-20 NOTE — Progress Notes (Signed)
 [place] Favia, CMA,acting as a Neurosurgeon for Susanna Epley, FNP.,have documented all relevant documentation on the behalf of Susanna Epley, FNP,as directed by  Susanna Epley, FNP while in the presence of Susanna Epley, FNP.  Subjective:  Patient ID: Elizabeth [name] , female    DOB: 26-Feb-1961 , 63 y.o.   MRN: [id number]  Chief Complaint  Patient presents with   Obesity    Patient presents today for a weight follow up, Patient reports compliance with medication. Patient denies any chest pain, SOB, or headaches. Patient hasn't had her qsymia  she reports she never got it.     HPI  Here for weight management. She has not been taking the qsymia  due to not hearing from [facility]. She has been trying to change her diet and exercising does more on the weekend.   Insomnia Primary symptoms: no sleep disturbance, frequent awakening.   PMH includes: associated symptoms present.      Past Medical History:  Diagnosis Date   Arthritis    ASCUS (atypical squamous cells of undetermined significance) on Pap smear 99,04, 4/10   Atrial fibrillation (HCC)    Chest pain 03/05/2013   Colon polyps    Hypertension    Shortness of breath 03/06/2013   " i had shortness of breath with my chest pain "     Family History  Problem Relation Age of Onset   Hypertension Mother    Diabetes Mother    Heart disease Mother    Heart disease Father    Cancer Father        stomach   Hypertension Father    Heart disease Maternal Grandmother    Hypertension Sister    Heart disease Brother    Diabetes Brother    Hypertension Brother      Current Outpatient Medications:    aspirin  EC 81 MG tablet, Take 81 mg by mouth daily., Disp: , Rfl:    atorvastatin  (LIPITOR) 20 MG tablet, Take 1 tablet (20 mg total) by mouth daily., Disp: 90 tablet, Rfl: 1   doxepin  (SINEQUAN ) 25 MG capsule, Take 1 capsule (25 mg total) by mouth at bedtime as needed., Disp: 90 capsule, Rfl: 1   estradiol  (ESTRACE ) 0.5 MG tablet, Take 1 tablet  (0.5 mg total) by mouth daily., Disp: 90 tablet, Rfl: 0   lisinopril -hydrochlorothiazide  (ZESTORETIC ) 20-12.5 MG tablet, Take 1 tablet by mouth daily for blood pressure, Disp: 90 tablet, Rfl: 3   meloxicam  (MOBIC ) 15 MG tablet, Take 1 tablet (15 mg total) by mouth once daily with food/meal, Disp: 30 tablet, Rfl: 2   methocarbamol  (ROBAXIN ) 500 MG tablet, Take 1 tablet (500 mg total) by mouth every 6-8 hours as needed., Disp: 40 tablet, Rfl: 2   nystatin  powder, Apply 1 Application topically 3 (three) times daily., Disp: 15 g, Rfl: 0   Phentermine -Topiramate  (QSYMIA ) 3.75-23 MG CP24, Take 1 capsule by mouth daily., Disp: 14 capsule, Rfl: 0   progesterone  (PROMETRIUM ) 100 MG capsule, Take 1 capsule (100 mg total) by mouth daily., Disp: 90 capsule, Rfl: 2   Phentermine -Topiramate  (QSYMIA ) 7.5-46 MG CP24, Take 1 capsule by mouth daily., Disp: 30 capsule, Rfl: 1   No Known Allergies   Review of Systems  Constitutional: Negative.   Respiratory: Negative.    Cardiovascular: Negative.   Musculoskeletal: Negative.   Skin:  Negative for rash.  Neurological: Negative.   Psychiatric/Behavioral:  Negative for sleep disturbance. The patient has insomnia.   All other systems reviewed and are negative.  Today's Vitals   02/20/24 1554  BP: 120/78  Pulse: 68  Temp: 98.5 F (36.9 C)  TempSrc: Oral  SpO2: 99%  Weight: 217 lb 9.6 oz (98.7 kg)  Height: 5\' 5"  (1.651 m)  PainSc: 0-No pain   Body mass index is 36.21 kg/m.  Wt Readings from Last 3 Encounters:  02/20/24 217 lb 9.6 oz (98.7 kg)  12/08/23 210 lb (95.3 kg)  10/17/23 214 lb 4.8 oz (97.2 kg)     Objective:  Physical Exam Vitals reviewed.  Constitutional:      General: She is not in acute distress.    Appearance: Normal appearance. She is well-developed. She is obese.  HENT:     Head: Normocephalic and atraumatic.  Eyes:     Pupils: Pupils are equal, round, and reactive to light.  Cardiovascular:     Rate and Rhythm: Normal  rate and regular rhythm.     Pulses: Normal pulses.     Heart sounds: Normal heart sounds. No murmur heard. Pulmonary:     Effort: Pulmonary effort is normal. No respiratory distress.     Breath sounds: Normal breath sounds. No wheezing.  Musculoskeletal:        General: Normal range of motion.  Skin:    General: Skin is warm and dry.     Capillary Refill: Capillary refill takes less than 2 seconds.  Neurological:     General: No focal deficit present.     Mental Status: She is alert and oriented to person, place, and time.     Cranial Nerves: No cranial nerve deficit.     Motor: No weakness.  Psychiatric:        Mood and Affect: Mood normal.        Behavior: Behavior normal.        Thought Content: Thought content normal.        Judgment: Judgment normal.         Assessment And Plan:  COVID-19 vaccination declined Assessment & Plan: Declines covid 19 vaccine. Discussed risk of covid 79 and if she changes her mind about the vaccine to call the office. Education has been provided regarding the importance of this vaccine but patient still declined. Advised may receive this vaccine at local pharmacy or Health Dept.or vaccine clinic. Aware to provide a copy of the vaccination record if obtained from local pharmacy or Health Dept.  Encouraged to take multivitamin, vitamin d, vitamin c and zinc to increase immune system. Aware can call office if would like to have vaccine here at office. Verbalized acceptance and understanding.    Class 2 obesity due to excess calories with body mass index (BMI) of 36.0 to 36.9 in adult, unspecified whether serious comorbidity present Assessment & Plan: I have resent the Qsymia  she is advised if she does not here from [facility] to let us  know. She is encouraged to continue focusing on healthy diet and regular exercise.   Orders: -     Phentermine -Topiramate ; Take 1 capsule by mouth daily.  Dispense: 30 capsule; Refill: 1 -     Phentermine -Topiramate ;  Take 1 capsule by mouth daily.  Dispense: 14 capsule; Refill: 0    Return for keep same next.  Patient was given opportunity to ask questions. Patient verbalized understanding of the plan and was able to repeat key elements of the plan. All questions were answered to their satisfaction.    Inge Mangle, FNP, have reviewed all documentation for this visit. The documentation on 02/20/24  for the exam, diagnosis, procedures, and orders are all accurate and complete.   IF YOU HAVE BEEN REFERRED TO A SPECIALIST, IT MAY TAKE 1-2 WEEKS TO SCHEDULE/PROCESS THE REFERRAL. IF YOU HAVE NOT HEARD FROM US /SPECIALIST IN TWO WEEKS, PLEASE GIVE US  A CALL AT [phone].

## 2024-02-26 DIAGNOSIS — E6609 Other obesity due to excess calories: Secondary | ICD-10-CM | POA: Insufficient documentation

## 2024-02-26 DIAGNOSIS — Z6836 Body mass index (BMI) 36.0-36.9, adult: Secondary | ICD-10-CM | POA: Insufficient documentation

## 2024-02-26 NOTE — Assessment & Plan Note (Signed)
 I have resent the Qsymia  she is advised if she does not here from [facility] to let us  know. She is encouraged to continue focusing on healthy diet and regular exercise.

## 2024-02-26 NOTE — Assessment & Plan Note (Signed)

## 2024-03-02 ENCOUNTER — Telehealth: Payer: Self-pay | Admitting: Obstetrics and Gynecology

## 2024-03-02 DIAGNOSIS — E2839 Other primary ovarian failure: Secondary | ICD-10-CM

## 2024-03-02 NOTE — Telephone Encounter (Signed)
 Reminder for bone scan Dr. Tia Flowers

## 2024-03-06 ENCOUNTER — Other Ambulatory Visit (HOSPITAL_COMMUNITY): Payer: Self-pay

## 2024-03-06 ENCOUNTER — Other Ambulatory Visit: Payer: Self-pay | Admitting: Nurse Practitioner

## 2024-03-07 ENCOUNTER — Other Ambulatory Visit (HOSPITAL_COMMUNITY): Payer: Self-pay

## 2024-03-07 MED ORDER — LISINOPRIL-HYDROCHLOROTHIAZIDE 20-12.5 MG PO TABS
1.0000 | ORAL_TABLET | Freq: Every day | ORAL | 3 refills | Status: AC
  Filled 2024-03-07: qty 90, 90d supply, fill #0
  Filled 2024-05-25: qty 90, 90d supply, fill #1
  Filled 2024-08-20: qty 90, 90d supply, fill #0

## 2024-03-29 ENCOUNTER — Other Ambulatory Visit (HOSPITAL_COMMUNITY): Payer: Self-pay

## 2024-03-29 ENCOUNTER — Other Ambulatory Visit: Payer: Self-pay | Admitting: Nurse Practitioner

## 2024-03-29 MED ORDER — ATORVASTATIN CALCIUM 20 MG PO TABS
20.0000 mg | ORAL_TABLET | Freq: Every day | ORAL | 1 refills | Status: DC
  Filled 2024-03-29: qty 90, 90d supply, fill #0
  Filled 2024-07-12: qty 90, 90d supply, fill #1

## 2024-04-05 ENCOUNTER — Other Ambulatory Visit (HOSPITAL_COMMUNITY): Payer: Self-pay

## 2024-04-05 ENCOUNTER — Other Ambulatory Visit: Payer: Self-pay | Admitting: Obstetrics and Gynecology

## 2024-04-05 DIAGNOSIS — Z79890 Hormone replacement therapy (postmenopausal): Secondary | ICD-10-CM

## 2024-04-05 MED ORDER — PROGESTERONE MICRONIZED 100 MG PO CAPS
100.0000 mg | ORAL_CAPSULE | Freq: Every day | ORAL | 2 refills | Status: AC
  Filled 2024-04-05: qty 90, 90d supply, fill #0
  Filled 2024-07-12: qty 90, 90d supply, fill #1
  Filled 2024-10-05: qty 90, 90d supply, fill #0

## 2024-04-05 NOTE — Telephone Encounter (Signed)
.  Med refill request: progesterone  100MG   Last AEX: 06/20/23 Next AEX: Not scheduled  Last MMG (if hormonal med) 01/17/24 BI-RADs Category 1 Neg.  Refill authorized: Please Advise?

## 2024-04-10 ENCOUNTER — Ambulatory Visit: Payer: Commercial Managed Care - PPO | Admitting: Plastic Surgery

## 2024-04-10 ENCOUNTER — Encounter: Payer: Self-pay | Admitting: Plastic Surgery

## 2024-04-10 VITALS — BP 123/82 | HR 71 | Ht 65.0 in | Wt 213.2 lb

## 2024-04-10 DIAGNOSIS — Z6835 Body mass index (BMI) 35.0-35.9, adult: Secondary | ICD-10-CM | POA: Diagnosis not present

## 2024-04-10 DIAGNOSIS — M793 Panniculitis, unspecified: Secondary | ICD-10-CM

## 2024-04-10 DIAGNOSIS — M546 Pain in thoracic spine: Secondary | ICD-10-CM | POA: Diagnosis not present

## 2024-04-10 DIAGNOSIS — M549 Dorsalgia, unspecified: Secondary | ICD-10-CM | POA: Insufficient documentation

## 2024-04-10 DIAGNOSIS — E66812 Obesity, class 2: Secondary | ICD-10-CM

## 2024-04-10 DIAGNOSIS — M542 Cervicalgia: Secondary | ICD-10-CM | POA: Diagnosis not present

## 2024-04-10 DIAGNOSIS — G8929 Other chronic pain: Secondary | ICD-10-CM

## 2024-04-10 NOTE — Progress Notes (Signed)
   Subjective:    Patient ID: Elizabeth [name], female    DOB: July 19, 1961, 63 y.o.   MRN: [id number]  The patient is a 63 year old female here for evaluation of her abdomen.  She is interested in a panniculectomy.  She has had significant weight loss and is 5 feet 5 inches tall and weighs 213 pounds.  Her BMI is 35.48 kg/m.  She is likely where she is going to stay from a weight standpoint.  She complains of neck and back pain.  She is quite aggravated by the skin breakdown and rashes that she gets in her skin folds due to the excess weight.  She does not seem to have any hernia at least not on exam.      Review of Systems  Constitutional: Negative.   HENT: Negative.    Eyes: Negative.   Respiratory: Negative.    Cardiovascular: Negative.   Gastrointestinal: Negative.   Endocrine: Negative.   Genitourinary: Negative.        Objective:   Physical Exam Vitals reviewed.  Constitutional:      Appearance: Normal appearance.  HENT:     Head: Atraumatic.  Cardiovascular:     Rate and Rhythm: Normal rate.     Pulses: Normal pulses.  Pulmonary:     Effort: Pulmonary effort is normal.  Skin:    General: Skin is warm.     Capillary Refill: Capillary refill takes less than 2 seconds.  Neurological:     Mental Status: She is alert and oriented to person, place, and time.  Psychiatric:        Mood and Affect: Mood normal.        Behavior: Behavior normal.        Thought Content: Thought content normal.        Assessment & Plan:     ICD-10-CM   1. Panniculitis  M79.3        The patient is a candidate for panniculectomy with possible add-on abdominal portion.  She is aware that it will not help her upper abdomen but is really focused on removal of the pannus.  I will send her a quote for the upper part.   Pictures were obtained of the patient and placed in the chart with the patient's or guardian's permission.

## 2024-04-17 ENCOUNTER — Encounter: Payer: Self-pay | Admitting: Nurse Practitioner

## 2024-04-23 ENCOUNTER — Encounter: Payer: Self-pay | Admitting: *Deleted

## 2024-04-23 ENCOUNTER — Encounter: Payer: Self-pay | Admitting: Plastic Surgery

## 2024-04-24 ENCOUNTER — Other Ambulatory Visit: Payer: Self-pay | Admitting: Obstetrics and Gynecology

## 2024-04-24 ENCOUNTER — Other Ambulatory Visit: Payer: Self-pay | Admitting: Nurse Practitioner

## 2024-04-24 DIAGNOSIS — F5101 Primary insomnia: Secondary | ICD-10-CM

## 2024-04-24 NOTE — Telephone Encounter (Signed)
.  Med refill request: Estradiol   Last AEX: 06/20/23 Next AEX:06/26/24  Last MMG (if hormonal med) 01/17/24 BI Rads Cat. 1 Neg Refill authorized: Please Advise?

## 2024-04-25 ENCOUNTER — Other Ambulatory Visit: Payer: Self-pay | Admitting: Nurse Practitioner

## 2024-04-25 ENCOUNTER — Other Ambulatory Visit: Payer: Self-pay

## 2024-04-25 ENCOUNTER — Other Ambulatory Visit (HOSPITAL_COMMUNITY): Payer: Self-pay

## 2024-04-25 DIAGNOSIS — E66812 Obesity, class 2: Secondary | ICD-10-CM

## 2024-04-25 MED ORDER — PHENTERMINE-TOPIRAMATE ER 7.5-46 MG PO CP24
1.0000 | ORAL_CAPSULE | Freq: Every day | ORAL | 1 refills | Status: DC
  Filled 2024-04-25 – 2024-05-10 (×4): qty 30, 30d supply, fill #0

## 2024-04-25 MED ORDER — DOXEPIN HCL 25 MG PO CAPS
25.0000 mg | ORAL_CAPSULE | Freq: Every evening | ORAL | 1 refills | Status: DC | PRN
  Filled 2024-04-25 – 2024-07-23 (×2): qty 90, 90d supply, fill #0

## 2024-04-25 NOTE — Telephone Encounter (Signed)
 Rx filled

## 2024-04-27 ENCOUNTER — Other Ambulatory Visit: Payer: Self-pay | Admitting: Obstetrics and Gynecology

## 2024-04-27 ENCOUNTER — Other Ambulatory Visit (HOSPITAL_COMMUNITY): Payer: Self-pay

## 2024-04-27 DIAGNOSIS — E2839 Other primary ovarian failure: Secondary | ICD-10-CM

## 2024-04-27 MED ORDER — ESTRADIOL 0.5 MG PO TABS
0.5000 mg | ORAL_TABLET | Freq: Every day | ORAL | 0 refills | Status: DC
  Filled 2024-04-27: qty 90, 90d supply, fill #0

## 2024-04-27 NOTE — Telephone Encounter (Signed)
 Med refill request: estradiol  0.5 mg Last AEX: 06/20/23 Next AEX: 06/26/24 Last MMG (if hormonal med) 12/20/23 BI-RADS 1 negative Refill authorized: Please Advise?

## 2024-04-27 NOTE — Telephone Encounter (Signed)
 Patient already has Rx for Prometrium . Geni, please confirm with patient she is taking both, estradiol  and Prometrium  and she has refills.

## 2024-05-04 ENCOUNTER — Ambulatory Visit (INDEPENDENT_AMBULATORY_CARE_PROVIDER_SITE_OTHER): Payer: Self-pay | Admitting: Plastic Surgery

## 2024-05-04 ENCOUNTER — Encounter: Payer: Self-pay | Admitting: Plastic Surgery

## 2024-05-04 VITALS — BP 176/77 | HR 62

## 2024-05-04 DIAGNOSIS — S01311A Laceration without foreign body of right ear, initial encounter: Secondary | ICD-10-CM

## 2024-05-04 DIAGNOSIS — Z719 Counseling, unspecified: Secondary | ICD-10-CM

## 2024-05-04 NOTE — Progress Notes (Signed)
Preoperative Dx: right split ear lobe  Postoperative Dx: right split ear lobe  Procedure: repair of right split ear lobe  Anesthesia: Lidocaine 1% with 1:100,000 epinepherine  Indication for Procedure: split ear lobes  Description of Procedure: Risks and complications were explained to the patient. Consent was confirmed and signed. Time out was called and all information was confirmed to be correct. The ear lobe was prepped with betadine and drapped. A z plasty type mark was made and then lidocaine 1% with epinepherine was injected in the subcutaneous area. After waiting several minutes for the lidocaine to take affect an #11 blade was used to make the incisions which included cutting out the previous epithelialized hole. A 5-0 Monocryl was used to reapproximate the deep tissue. The skin edges were reapproximated with 6-0 Monocryl interrupted sutures. Steri strips were applied. The patient is to follow up in one week. She tolerated the procedure well and there were no complications. Risks and complicatins were discussed and consent signed.

## 2024-05-08 ENCOUNTER — Other Ambulatory Visit (HOSPITAL_BASED_OUTPATIENT_CLINIC_OR_DEPARTMENT_OTHER): Payer: Self-pay

## 2024-05-08 ENCOUNTER — Other Ambulatory Visit (HOSPITAL_COMMUNITY): Payer: Self-pay

## 2024-05-09 ENCOUNTER — Other Ambulatory Visit (HOSPITAL_COMMUNITY): Payer: Self-pay

## 2024-05-09 ENCOUNTER — Other Ambulatory Visit: Payer: Self-pay | Admitting: Nurse Practitioner

## 2024-05-09 DIAGNOSIS — E66812 Obesity, class 2: Secondary | ICD-10-CM

## 2024-05-09 NOTE — Telephone Encounter (Signed)
 Copied from CRM [id number]. Topic: Clinical - Medication Refill >> May 09, 2024  1:59 PM Lauren C wrote: Medication:  Phentermine -Topiramate  (QSYMIA ) 3.75-23 MG CP24  Phentermine -Topiramate  ER (QSYMIA ) 7.5-46 MG CP24   Has the patient contacted their pharmacy? Yes (Agent: If no, request that the patient contact the pharmacy for the refill. If patient does not wish to contact the pharmacy document the reason why and proceed with request.) (Agent: If yes, when and what did the pharmacy advise?)  Online ordering system out of service, she was unable to request refill with them.  This is the patient's preferred pharmacy:   [facility] [place]  - [place], [place] - [id number] [facility] [address] [place] [address] Phone: [phone] Fax: [phone]   Is this the correct pharmacy for this prescription? Yes If no, delete pharmacy and type the correct one.   Has the prescription been filled recently? Yes  Is the patient out of the medication? No  Has the patient been seen for an appointment in the last year OR does the patient have an upcoming appointment? Yes  Can we respond through MyChart? Yes  Agent: Please be advised that Rx refills may take up to 3 business days. We ask that you follow-up with your pharmacy.

## 2024-05-10 ENCOUNTER — Other Ambulatory Visit: Payer: Self-pay | Admitting: Nurse Practitioner

## 2024-05-10 ENCOUNTER — Other Ambulatory Visit: Payer: Self-pay

## 2024-05-10 ENCOUNTER — Other Ambulatory Visit (HOSPITAL_COMMUNITY): Payer: Self-pay

## 2024-05-10 ENCOUNTER — Other Ambulatory Visit (HOSPITAL_BASED_OUTPATIENT_CLINIC_OR_DEPARTMENT_OTHER): Payer: Self-pay

## 2024-05-10 DIAGNOSIS — E66812 Obesity, class 2: Secondary | ICD-10-CM

## 2024-05-10 MED ORDER — PHENTERMINE-TOPIRAMATE ER 7.5-46 MG PO CP24
1.0000 | ORAL_CAPSULE | Freq: Every day | ORAL | 1 refills | Status: DC
  Filled 2024-05-10: qty 30, 30d supply, fill #0

## 2024-05-11 ENCOUNTER — Other Ambulatory Visit (HOSPITAL_BASED_OUTPATIENT_CLINIC_OR_DEPARTMENT_OTHER): Payer: Self-pay

## 2024-05-14 ENCOUNTER — Other Ambulatory Visit: Payer: Self-pay | Admitting: Nurse Practitioner

## 2024-05-14 ENCOUNTER — Other Ambulatory Visit: Payer: Self-pay

## 2024-05-14 DIAGNOSIS — E66812 Obesity, class 2: Secondary | ICD-10-CM

## 2024-05-14 MED ORDER — PHENTERMINE-TOPIRAMATE ER 7.5-46 MG PO CP24: 1.0000 | ORAL_CAPSULE | Freq: Every day | ORAL | 1 refills | Status: DC

## 2024-05-15 ENCOUNTER — Ambulatory Visit (INDEPENDENT_AMBULATORY_CARE_PROVIDER_SITE_OTHER): Payer: Self-pay | Admitting: Physician Assistant

## 2024-05-15 ENCOUNTER — Ambulatory Visit: Payer: Self-pay | Admitting: Surgical

## 2024-05-15 VITALS — BP 133/80 | HR 66

## 2024-05-15 DIAGNOSIS — Z719 Counseling, unspecified: Secondary | ICD-10-CM

## 2024-05-15 DIAGNOSIS — S01311A Laceration without foreign body of right ear, initial encounter: Secondary | ICD-10-CM

## 2024-05-15 NOTE — Progress Notes (Signed)
 Patient is a pleasant 63 year old female s/p torn right earlobe repair performed 05/04/2024 in office by Dr. Lowery who returns to clinic for postprocedural follow-up.  Reviewed procedural note and the skin edges were reapproximated with 6-0 Monocryl interrupted sutures.  Steri-Strips were applied.  Today, patient is doing well from a postprocedural standpoint.  No specific concerns or complaints.  On exam, Steri-Strip is gently removed.  Skin edges from Z-plasty appear well-approximated.  Slight scabbing noted.  Scattered sutures removed.  Recommended Vaseline daily.  The scabbing will likely fall off.  Any residual sutures are absorbable and will breakdown spontaneously.  She can also return to clinic for removal if desired.  No specific follow-up needed.  Picture(s) obtained of the patient and placed in the chart were with the patient's or guardian's permission.

## 2024-06-11 ENCOUNTER — Ambulatory Visit: Payer: Self-pay

## 2024-06-20 ENCOUNTER — Telehealth: Payer: Self-pay | Admitting: Plastic Surgery

## 2024-06-20 NOTE — Telephone Encounter (Signed)
 Patient called inquiring about her appeal that you sent, please reach out and adivse

## 2024-06-26 ENCOUNTER — Encounter: Payer: Self-pay | Admitting: Obstetrics and Gynecology

## 2024-06-26 ENCOUNTER — Ambulatory Visit: Admitting: Obstetrics and Gynecology

## 2024-06-26 VITALS — BP 122/60 | Ht 64.37 in | Wt 211.6 lb

## 2024-06-26 DIAGNOSIS — Z01419 Encounter for gynecological examination (general) (routine) without abnormal findings: Secondary | ICD-10-CM

## 2024-06-26 DIAGNOSIS — E2839 Other primary ovarian failure: Secondary | ICD-10-CM

## 2024-06-26 DIAGNOSIS — Z79890 Hormone replacement therapy (postmenopausal): Secondary | ICD-10-CM

## 2024-06-26 DIAGNOSIS — Z1331 Encounter for screening for depression: Secondary | ICD-10-CM | POA: Diagnosis not present

## 2024-06-26 DIAGNOSIS — E65 Localized adiposity: Secondary | ICD-10-CM

## 2024-06-26 DIAGNOSIS — R21 Rash and other nonspecific skin eruption: Secondary | ICD-10-CM

## 2024-06-26 DIAGNOSIS — Z1211 Encounter for screening for malignant neoplasm of colon: Secondary | ICD-10-CM

## 2024-06-26 MED ORDER — NYSTATIN 100000 UNIT/GM EX POWD: 1.0000 | Freq: Three times a day (TID) | CUTANEOUS | 12 refills | Status: AC

## 2024-06-26 MED ORDER — ESTRADIOL 0.5 MG PO TABS: 0.5000 mg | ORAL_TABLET | Freq: Every day | ORAL | 6 refills | Status: AC

## 2024-06-26 MED ORDER — PROGESTERONE MICRONIZED 100 MG PO CAPS: 100.0000 mg | ORAL_CAPSULE | Freq: Every day | ORAL | 6 refills | Status: AC

## 2024-06-26 NOTE — Progress Notes (Signed)
 63 y.o. y.o. female here for annual exam. Patient's last menstrual period was 10/02/2013.   y.o. female here for annual exam. She denies any PM bleeding.   Patient's last menstrual period was 10/02/2013.   HPI: Postmenopause, well on HRT with Estradiol  0.5 mg tab PO daily and Prometrium  100 mg PO HS.  No PMB.  No pelvic pain.  Abstinent.  No h/o abnormal Pap.  Pap Neg 2024.  Will repeat Pap at 3 years.  Breasts normal. Mammo Neg 12/2023.  Urine/BMs normal.  BMI 38.49.  Lower calorie/carb.  Increase fitness activities.  Colono 03/2014. Health labs with Fam MD. Planning to have panniculectomy soon due to yeast infections under the panus.  Still having pain from the rash.  Dxa: referral for baseline placed. No fractures.  Body mass index is 35.9 kg/m.     06/26/2024    1:34 PM 04/14/2023    3:25 PM 01/11/2023    3:08 PM  Depression screen PHQ 2/9  Decreased Interest 0 0 0  Down, Depressed, Hopeless 0 0 0  PHQ - 2 Score 0 0 0  Altered sleeping  0   Tired, decreased energy  0   Change in appetite  0   Feeling bad or failure about yourself   0   Trouble concentrating  0   Moving slowly or fidgety/restless  0   Suicidal thoughts  0   PHQ-9 Score  0   Difficult doing work/chores  Not difficult at all     Blood pressure 122/60, height 5' 4.37 (1.635 m), weight 211 lb 9.6 oz (96 kg), last menstrual period 10/02/2013.     Component Value Date/Time   DIAGPAP  06/20/2023 1552    - Negative for intraepithelial lesion or malignancy (NILM)   HPVHIGH Negative 06/20/2023 1552   ADEQPAP  06/20/2023 1552    Satisfactory for evaluation; transformation zone component ABSENT.    GYN HISTORY:    Component Value Date/Time   DIAGPAP  06/20/2023 1552    - Negative for intraepithelial lesion or malignancy (NILM)   HPVHIGH Negative 06/20/2023 1552   ADEQPAP  06/20/2023 1552    Satisfactory for evaluation; transformation zone component ABSENT.    OB History  Gravida Para Term Preterm AB  Living  4 3 3  1 3   SAB IAB Ectopic Multiple Live Births  1        # Outcome Date GA Lbr Len/2nd Weight Sex Type Anes PTL Lv  4 SAB           3 Term           2 Term           1 Term             Past Medical History:  Diagnosis Date   Arthritis    ASCUS (atypical squamous cells of undetermined significance) on Pap smear 99,04, 4/10   Atrial fibrillation (HCC)    Chest pain 03/05/2013   Colon polyps    Hypertension    Shortness of breath 03/06/2013    i had shortness of breath with my chest pain     Past Surgical History:  Procedure Laterality Date   CARDIAC CATHETERIZATION  3/09   TONSILLECTOMY     TUBAL LIGATION      Current Outpatient Medications on File Prior to Visit  Medication Sig Dispense Refill   aspirin  EC 81 MG tablet Take 81 mg by mouth daily.     atorvastatin  (LIPITOR)  20 MG tablet Take 1 tablet (20 mg total) by mouth daily. 90 tablet 1   doxepin  (SINEQUAN ) 25 MG capsule Take 1 capsule (25 mg total) by mouth at bedtime as needed. 90 capsule 1   estradiol  (ESTRACE ) 0.5 MG tablet Take 1 tablet (0.5 mg total) by mouth daily. 90 tablet 0   lisinopril -hydrochlorothiazide  (ZESTORETIC ) 20-12.5 MG tablet Take 1 tablet by mouth daily for blood pressure 90 tablet 3   methocarbamol  (ROBAXIN ) 500 MG tablet Take 1 tablet (500 mg total) by mouth every 6-8 hours as needed. 40 tablet 2   nystatin  powder Apply 1 Application topically 3 (three) times daily. 15 g 0   Phentermine -Topiramate  ER (QSYMIA ) 7.5-46 MG CP24 Take 1 capsule by mouth daily. 30 capsule 1   progesterone  (PROMETRIUM ) 100 MG capsule Take 1 capsule (100 mg total) by mouth daily. 90 capsule 2   meloxicam  (MOBIC ) 15 MG tablet Take 1 tablet (15 mg total) by mouth once daily with food/meal (Patient not taking: Reported on 06/26/2024) 30 tablet 2   No current facility-administered medications on file prior to visit.    Social History   Socioeconomic History   Marital status: Single    Spouse name: Not on file    Number of children: Not on file   Years of education: Not on file   Highest education level: Some college, no degree  Occupational History   Not on file  Tobacco Use   Smoking status: Never   Smokeless tobacco: Never  Vaping Use   Vaping status: Never Used  Substance and Sexual Activity   Alcohol use: Yes    Comment: social   Drug use: No   Sexual activity: Yes    Partners: Male    Birth control/protection: Surgical    Comment: Tubal lig-1st intercourse 17 yo-5 partners  Other Topics Concern   Not on file  Social History Narrative   Not on file   Social Drivers of Health   Financial Resource Strain: Low Risk  (10/16/2023)   Overall Financial Resource Strain (CARDIA)    Difficulty of Paying Living Expenses: Not hard at all  Food Insecurity: No Food Insecurity (10/16/2023)   Hunger Vital Sign    Worried About Running Out of Food in the Last Year: Never true    Ran Out of Food in the Last Year: Never true  Transportation Needs: No Transportation Needs (10/16/2023)   PRAPARE - Administrator, Civil Service (Medical): No    Lack of Transportation (Non-Medical): No  Physical Activity: Insufficiently Active (10/16/2023)   Exercise Vital Sign    Days of Exercise per Week: 1 day    Minutes of Exercise per Session: 30 min  Stress: Stress Concern Present (10/16/2023)   [employer] of Occupational Health - Occupational Stress Questionnaire    Feeling of Stress : Rather much  Social Connections: Moderately Integrated (10/16/2023)   Social Connection and Isolation Panel    Frequency of Communication with Friends and Family: Twice a week    Frequency of Social Gatherings with Friends and Family: Once a week    Attends Religious Services: More than 4 times per year    Active Member of [employer] or Organizations: Yes    Attends Engineer, structural: More than 4 times per year    Marital Status: Divorced  Catering manager Violence: Not on file    Family History   Problem Relation Age of Onset   Hypertension Mother    Diabetes Mother  Heart disease Mother    Heart disease Father    Cancer Father        stomach   Hypertension Father    Heart disease Maternal Grandmother    Hypertension Sister    Heart disease Brother    Diabetes Brother    Hypertension Brother      No Known Allergies    Patient's last menstrual period was Patient's last menstrual period was 10/02/2013.SABRA            Review of Systems Alls systems reviewed and are negative.     Physical Exam Constitutional:      Appearance: Normal appearance.  Genitourinary:     Vulva and urethral meatus normal.     No lesions in the vagina.     Right Labia: No rash, lesions or skin changes.    Left Labia: No lesions, skin changes or rash.    No vaginal discharge or tenderness.     No vaginal prolapse present.    No vaginal atrophy present.     Right Adnexa: not tender, not palpable and no mass present.    Left Adnexa: not tender, not palpable and no mass present.    No cervical motion tenderness or discharge.     Uterus is not enlarged, tender or irregular.  Breasts:    Right: Normal.     Left: Normal.  HENT:     Head: Normocephalic.  Neck:     Thyroid : No thyroid  mass, thyromegaly or thyroid  tenderness.  Cardiovascular:     Rate and Rhythm: Normal rate and regular rhythm.     Heart sounds: Normal heart sounds, S1 normal and S2 normal.  Pulmonary:     Effort: Pulmonary effort is normal.     Breath sounds: Normal breath sounds and air entry.  Abdominal:     General: There is no distension.     Palpations: Abdomen is soft. There is no mass.     Tenderness: There is no abdominal tenderness. There is no guarding or rebound.  Musculoskeletal:        General: Normal range of motion.     Cervical back: Full passive range of motion without pain, normal range of motion and neck supple. No tenderness.     Right lower leg: No edema.     Left lower leg: No edema.   Neurological:     Mental Status: She is alert.  Skin:    General: Skin is warm.  Psychiatric:        Mood and Affect: Mood normal.        Behavior: Behavior normal.        Thought Content: Thought content normal.  Vitals and nursing note reviewed. Exam conducted with a chaperone present.       A:         Well Woman GYN exam                             P:        Pap smear not indicated Encouraged annual mammogram screening Colon cancer screening referral placed today DXA ordered today Labs and immunizations to do with PMD Discussed breast self exams Encouraged healthy lifestyle practices Encouraged Vit D and Calcium    No follow-ups on file.  Almarie MARLA Carpen

## 2024-06-27 ENCOUNTER — Ambulatory Visit (INDEPENDENT_AMBULATORY_CARE_PROVIDER_SITE_OTHER): Payer: Self-pay | Admitting: Nurse Practitioner

## 2024-06-27 ENCOUNTER — Encounter: Payer: Self-pay | Admitting: Nurse Practitioner

## 2024-06-27 VITALS — BP 130/80 | HR 76 | Temp 99.0°F | Ht 64.0 in | Wt 211.0 lb

## 2024-06-27 DIAGNOSIS — E6609 Other obesity due to excess calories: Secondary | ICD-10-CM

## 2024-06-27 DIAGNOSIS — E66812 Obesity, class 2: Secondary | ICD-10-CM | POA: Diagnosis not present

## 2024-06-27 DIAGNOSIS — Z2821 Immunization not carried out because of patient refusal: Secondary | ICD-10-CM | POA: Diagnosis not present

## 2024-06-27 DIAGNOSIS — Z6836 Body mass index (BMI) 36.0-36.9, adult: Secondary | ICD-10-CM

## 2024-06-27 MED ORDER — PHENTERMINE-TOPIRAMATE ER 11.25-69 MG PO CP24: 1.0000 | ORAL_CAPSULE | Freq: Every day | ORAL | 1 refills | Status: DC

## 2024-06-27 NOTE — Progress Notes (Signed)
 LILLETTE Kristeen JINNY Gladis, CMA,acting as a Neurosurgeon for Elizabeth Ada, FNP.,have documented all relevant documentation on the behalf of Elizabeth Ada, FNP,as directed by  Elizabeth Ada, FNP while in the presence of Elizabeth Ada, FNP.  Subjective:  Patient ID: Elizabeth [name] , female    DOB: [birth year] , 63 y.o.   MRN: [id number]  Chief Complaint  Patient presents with   Obesity    Patient presents today for a Weight follow up, Patient reports compliance with medication. Patient denies any chest pain, SOB, or headaches. Patient has no concerns today.     HPI   Discussed the use of AI scribe software for clinical note transcription with the patient, who gave verbal consent to proceed.  History of Present Illness [name] is a 63 year old female who presents for a follow-up on Qsymia  medication and weight management.  She has been on Qsymia  for at least two months and is here for a refill. The medication is curbing her appetite.  She has increased her physical activity, now exercising three times a week, up from once a week. Her routine includes walking and using weights at home.  Her diet has improved; she has reduced her fruit intake due to sugar content and increased her water intake. Her recent blood pressure reading was 120/60.   Past Medical History:  Diagnosis Date   Arthritis    ASCUS (atypical squamous cells of undetermined significance) on Pap smear 99,04, 4/10   Atrial fibrillation (HCC)    Chest pain 03/05/2013   Colon polyps    Hyperlipidemia    Hypertension    Shortness of breath 03/06/2013    i had shortness of breath with my chest pain      Family History  Problem Relation Age of Onset   Hypertension Mother    Diabetes Mother    Heart disease Mother    Arthritis Mother    Heart disease Father    Cancer Father        stomach   Hypertension Father    Heart disease Maternal Grandmother    Hypertension Sister    Heart disease Brother    Diabetes Brother     Hypertension Brother      Current Outpatient Medications:    aspirin  EC 81 MG tablet, Take 81 mg by mouth daily., Disp: , Rfl:    atorvastatin  (LIPITOR) 20 MG tablet, Take 1 tablet (20 mg total) by mouth daily., Disp: 90 tablet, Rfl: 1   doxepin  (SINEQUAN ) 25 MG capsule, Take 1 capsule (25 mg total) by mouth at bedtime as needed., Disp: 90 capsule, Rfl: 1   estradiol  (ESTRACE ) 0.5 MG tablet, Take 1 tablet (0.5 mg total) by mouth daily., Disp: 90 tablet, Rfl: 6   lisinopril -hydrochlorothiazide  (ZESTORETIC ) 20-12.5 MG tablet, Take 1 tablet by mouth daily for blood pressure, Disp: 90 tablet, Rfl: 3   methocarbamol  (ROBAXIN ) 500 MG tablet, Take 1 tablet (500 mg total) by mouth every 6-8 hours as needed., Disp: 40 tablet, Rfl: 2   nystatin  powder, Apply 1 Application topically 3 (three) times daily., Disp: 15 g, Rfl: 12   Phentermine -Topiramate  ER 11.25-69 MG CP24, Take 1 capsule by mouth daily., Disp: 30 capsule, Rfl: 1   progesterone  (PROMETRIUM ) 100 MG capsule, Take 1 capsule (100 mg total) by mouth daily., Disp: 90 capsule, Rfl: 2   progesterone  (PROMETRIUM ) 100 MG capsule, Take 1 capsule (100 mg total) by mouth at bedtime., Disp: 90 capsule, Rfl: 6   meloxicam  (MOBIC ) 15  MG tablet, Take 1 tablet (15 mg total) by mouth once daily with food/meal (Patient not taking: Reported on 06/27/2024), Disp: 30 tablet, Rfl: 2   No Known Allergies   Review of Systems  Constitutional: Negative.   Respiratory: Negative.    Cardiovascular: Negative.   Musculoskeletal: Negative.   Skin:  Negative for rash.  Neurological: Negative.   Psychiatric/Behavioral: Negative.       Today's Vitals   06/27/24 1604  BP: 130/80  Pulse: 76  Temp: 99 F (37.2 C)  TempSrc: Oral  Weight: 211 lb (95.7 kg)  Height: 5' 4 (1.626 m)  PainSc: 0-No pain   Body mass index is 36.22 kg/m.  Wt Readings from Last 3 Encounters:  06/27/24 211 lb (95.7 kg)  06/26/24 211 lb 9.6 oz (96 kg)  04/10/24 213 lb 3.2 oz (96.7 kg)      Objective:  Physical Exam Vitals and nursing note reviewed.  Constitutional:      General: She is not in acute distress.    Appearance: Normal appearance. She is well-developed. She is obese.  HENT:     Head: Normocephalic and atraumatic.  Eyes:     Pupils: Pupils are equal, round, and reactive to light.  Cardiovascular:     Rate and Rhythm: Normal rate and regular rhythm.     Pulses: Normal pulses.     Heart sounds: Normal heart sounds. No murmur heard. Pulmonary:     Effort: Pulmonary effort is normal. No respiratory distress.     Breath sounds: Normal breath sounds. No wheezing.  Musculoskeletal:        General: Normal range of motion.  Skin:    General: Skin is warm and dry.     Capillary Refill: Capillary refill takes less than 2 seconds.  Neurological:     General: No focal deficit present.     Mental Status: She is alert and oriented to person, place, and time.     Cranial Nerves: No cranial nerve deficit.     Motor: No weakness.  Psychiatric:        Mood and Affect: Mood normal.        Behavior: Behavior normal.        Thought Content: Thought content normal.        Judgment: Judgment normal.     Assessment And Plan:  Herpes zoster vaccination declined  Class 2 obesity due to excess calories with body mass index (BMI) of 36.0 to 36.9 in adult, unspecified whether serious comorbidity present Assessment & Plan: Blood pressure is stable. - Increase Qsymia  dose to 11.25 mg/69 mg, 30-day supply, one refill. - Continue exercise at least three times a week. - Maintain dietary improvements with balanced nutrition and hydration. - Confirm prescription receipt with [facility].  Orders: -     Phentermine -Topiramate  ER; Take 1 capsule by mouth daily.  Dispense: 30 capsule; Refill: 1     Return for 2 months weight check.  Patient was given opportunity to ask questions. Patient verbalized understanding of the plan and was able to repeat key elements of the plan.  All questions were answered to their satisfaction.    LILLETTE Elizabeth Ada, FNP, have reviewed all documentation for this visit. The documentation on 06/27/24 for the exam, diagnosis, procedures, and orders are all accurate and complete.   IF YOU HAVE BEEN REFERRED TO A SPECIALIST, IT MAY TAKE 1-2 WEEKS TO SCHEDULE/PROCESS THE REFERRAL. IF YOU HAVE NOT HEARD FROM US /SPECIALIST IN TWO WEEKS, PLEASE GIVE US  A  CALL AT [phone].

## 2024-07-03 ENCOUNTER — Encounter: Payer: Self-pay | Admitting: *Deleted

## 2024-07-08 NOTE — Assessment & Plan Note (Signed)
 Blood pressure is stable. - Increase Qsymia  dose to 11.25 mg/69 mg, 30-day supply, one refill. - Continue exercise at least three times a week. - Maintain dietary improvements with balanced nutrition and hydration. - Confirm prescription receipt with [facility].

## 2024-07-12 ENCOUNTER — Other Ambulatory Visit (HOSPITAL_COMMUNITY): Payer: Self-pay

## 2024-07-12 ENCOUNTER — Other Ambulatory Visit: Payer: Self-pay

## 2024-07-23 ENCOUNTER — Other Ambulatory Visit: Payer: Self-pay | Admitting: Nurse Practitioner

## 2024-07-23 ENCOUNTER — Other Ambulatory Visit (HOSPITAL_COMMUNITY): Payer: Self-pay

## 2024-07-24 ENCOUNTER — Other Ambulatory Visit: Payer: Self-pay

## 2024-07-24 ENCOUNTER — Other Ambulatory Visit (HOSPITAL_COMMUNITY): Payer: Self-pay

## 2024-07-24 MED ORDER — ATORVASTATIN CALCIUM 20 MG PO TABS
20.0000 mg | ORAL_TABLET | Freq: Every day | ORAL | 1 refills | Status: AC
  Filled 2024-07-24 – 2024-10-05 (×2): qty 90, 90d supply, fill #0

## 2024-07-24 NOTE — Telephone Encounter (Signed)
 Patient called to follow up on the denial, she requested a call back from [place]. Call back is [phone]

## 2024-07-25 ENCOUNTER — Other Ambulatory Visit (HOSPITAL_COMMUNITY): Payer: Self-pay

## 2024-07-30 ENCOUNTER — Ambulatory Visit

## 2024-08-14 ENCOUNTER — Ambulatory Visit (HOSPITAL_BASED_OUTPATIENT_CLINIC_OR_DEPARTMENT_OTHER)
Admission: RE | Admit: 2024-08-14 | Discharge: 2024-08-14 | Disposition: A | Discharge status: Home / Self Care | Source: Ambulatory Visit | Attending: Obstetrics and Gynecology | Admitting: Obstetrics and Gynecology

## 2024-08-14 ENCOUNTER — Ambulatory Visit: Payer: Self-pay | Admitting: Obstetrics and Gynecology

## 2024-08-14 DIAGNOSIS — E2839 Other primary ovarian failure: Secondary | ICD-10-CM | POA: Diagnosis not present

## 2024-08-14 DIAGNOSIS — Z01419 Encounter for gynecological examination (general) (routine) without abnormal findings: Secondary | ICD-10-CM | POA: Diagnosis not present

## 2024-08-14 DIAGNOSIS — Z780 Asymptomatic menopausal state: Secondary | ICD-10-CM | POA: Diagnosis not present

## 2024-08-20 ENCOUNTER — Other Ambulatory Visit (HOSPITAL_COMMUNITY): Payer: Self-pay

## 2024-08-29 ENCOUNTER — Encounter: Payer: Self-pay | Admitting: Nurse Practitioner

## 2024-08-29 ENCOUNTER — Telehealth: Payer: Self-pay | Admitting: Nurse Practitioner

## 2024-08-29 VITALS — Ht 64.0 in | Wt 210.0 lb

## 2024-08-29 DIAGNOSIS — Z6836 Body mass index (BMI) 36.0-36.9, adult: Secondary | ICD-10-CM | POA: Diagnosis not present

## 2024-08-29 DIAGNOSIS — E6609 Other obesity due to excess calories: Secondary | ICD-10-CM | POA: Diagnosis not present

## 2024-08-29 DIAGNOSIS — Z713 Dietary counseling and surveillance: Secondary | ICD-10-CM | POA: Diagnosis not present

## 2024-08-29 MED ORDER — PHENTERMINE-TOPIRAMATE ER 15-92 MG PO CP24: 1.0000 | ORAL_CAPSULE | Freq: Every day | ORAL | 1 refills | Status: AC

## 2024-08-29 NOTE — Progress Notes (Unsigned)
 Virtual Visit via Video Note  Elizabeth [name], CMA,acting as a scribe for Gaines Ada, FNP.,have documented all relevant documentation on the behalf of Gaines Ada, FNP,as directed by  Gaines Ada, FNP while in the presence of Gaines Ada, FNP.  I connected with Elizabeth [name] on 09/05/2024 at  4:00 PM EST by a video enabled telemedicine application and verified that I am speaking with the correct person using two identifiers.  Patient Location: Home Provider Location: Other:     I discussed the limitations, risks, security, and privacy concerns of performing an evaluation and management service by video and the availability of in person appointments. I also discussed with the patient that there may be a patient responsible charge related to this service. The patient expressed understanding and agreed to proceed.  Subjective: PCP: Ada Gaines, FNP  Chief Complaint  Patient presents with   Obesity    Patient presents today for a weight follow up, Patient reports compliance with medication. Patient denies any chest pain, SOB, or headaches. Patient has no concerns today.    Virtual visit due to weight check, She does report that she is eating meals described as below:  Breakfast; apple, coffee, biscoff Lunch: homemade soup Dinner: she may eat homemade soup  She can do sandwiches, sliced turkey, sliced chicken.   She is working on her core this week with videos.  Cardio she plans to start next week. She chose this direction due to having back pain and her leg pain is on 10/10. She has been doing a lot of walking with [employer]. She just has to push through the pain.   She goes in to work at 6:15a. She made homemade soup     ROS: Per HPI  Current Outpatient Medications:    aspirin  EC 81 MG tablet, Take 81 mg by mouth daily., Disp: , Rfl:    atorvastatin  (LIPITOR) 20 MG tablet, Take 1 tablet (20 mg total) by mouth daily., Disp: 90 tablet, Rfl: 1   doxepin  (SINEQUAN )  25 MG capsule, Take 1 capsule (25 mg total) by mouth at bedtime as needed., Disp: 90 capsule, Rfl: 1   estradiol  (ESTRACE ) 0.5 MG tablet, Take 1 tablet (0.5 mg total) by mouth daily., Disp: 90 tablet, Rfl: 6   lisinopril -hydrochlorothiazide  (ZESTORETIC ) 20-12.5 MG tablet, Take 1 tablet by mouth daily for blood pressure, Disp: 90 tablet, Rfl: 3   meloxicam  (MOBIC ) 15 MG tablet, Take 1 tablet (15 mg total) by mouth once daily with food/meal, Disp: 30 tablet, Rfl: 2   methocarbamol  (ROBAXIN ) 500 MG tablet, Take 1 tablet (500 mg total) by mouth every 6-8 hours as needed., Disp: 40 tablet, Rfl: 2   nystatin  powder, Apply 1 Application topically 3 (three) times daily., Disp: 15 g, Rfl: 12   Phentermine -Topiramate  ER (QSYMIA ) 15-92 MG CP24, Take 1 capsule by mouth daily., Disp: 30 capsule, Rfl: 1   progesterone  (PROMETRIUM ) 100 MG capsule, Take 1 capsule (100 mg total) by mouth daily., Disp: 90 capsule, Rfl: 2   progesterone  (PROMETRIUM ) 100 MG capsule, Take 1 capsule (100 mg total) by mouth at bedtime., Disp: 90 capsule, Rfl: 6  Observations/Objective: Today's Vitals   08/29/24 1558  Weight: 210 lb (95.3 kg)  Height: 5' 4 (1.626 m)   Wt Readings from Last 3 Encounters:  08/29/24 210 lb (95.3 kg)  06/27/24 211 lb (95.7 kg)  06/26/24 211 lb 9.6 oz (96 kg)    Physical Exam Vitals and nursing note reviewed.  Constitutional:  General: She is not in acute distress.    Appearance: Normal appearance. She is obese.  Pulmonary:     Effort: Pulmonary effort is normal. No respiratory distress.  Skin:    Capillary Refill: Capillary refill takes less than 2 seconds.  Neurological:     General: No focal deficit present.     Mental Status: She is alert and oriented to person, place, and time.     Cranial Nerves: No cranial nerve deficit.  Psychiatric:        Mood and Affect: Mood normal.        Behavior: Behavior normal.        Thought Content: Thought content normal.        Judgment: Judgment  normal.    Assessment and Plan: Class 2 obesity due to excess calories with body mass index (BMI) of 36.0 to 36.9 in adult, unspecified whether serious comorbidity present Assessment & Plan: I have discussed with her the importance of eating a well balanced diet with protein, fruits and vegetables. She is encouraged to increase her physical activity to at least 150 minutes. Will send refill for Qsymia  to [facility].   Orders: -     Phentermine -Topiramate  ER; Take 1 capsule by mouth daily.  Dispense: 30 capsule; Refill: 1    Follow Up Instructions: Return for 2 months weight check.   I discussed the assessment and treatment plan with the patient. The patient was provided an opportunity to ask questions, and all were answered. The patient agreed with the plan and demonstrated an understanding of the instructions.   The patient was advised to call back or seek an in-person evaluation if the symptoms worsen or if the condition fails to improve as anticipated.  The above assessment and management plan was discussed with the patient. The patient verbalized understanding of and has agreed to the management plan.   Elizabeth Gaines Ada, FNP, have reviewed all documentation for this visit. The documentation on 08/29/2024 for the exam, diagnosis, procedures, and orders are all accurate and complete.

## 2024-09-05 NOTE — Assessment & Plan Note (Signed)
 I have discussed with her the importance of eating a well balanced diet with protein, fruits and vegetables. She is encouraged to increase her physical activity to at least 150 minutes. Will send refill for Qsymia  to [facility].

## 2024-09-17 ENCOUNTER — Other Ambulatory Visit (HOSPITAL_COMMUNITY): Payer: Self-pay

## 2024-09-17 DIAGNOSIS — M25562 Pain in left knee: Secondary | ICD-10-CM | POA: Diagnosis not present

## 2024-09-17 MED ORDER — MELOXICAM 15 MG PO TABS
15.0000 mg | ORAL_TABLET | Freq: Every day | ORAL | 2 refills | Status: AC
  Filled 2024-09-17: qty 30, 30d supply, fill #0

## 2024-09-17 MED ORDER — CYCLOBENZAPRINE HCL 5 MG PO TABS
5.0000 mg | ORAL_TABLET | Freq: Two times a day (BID) | ORAL | 0 refills | Status: AC | PRN
  Filled 2024-09-17 – 2024-09-18 (×2): qty 40, 20d supply, fill #0

## 2024-09-18 ENCOUNTER — Other Ambulatory Visit (HOSPITAL_COMMUNITY): Payer: Self-pay

## 2024-10-05 ENCOUNTER — Other Ambulatory Visit (HOSPITAL_COMMUNITY): Payer: Self-pay

## 2024-10-05 ENCOUNTER — Other Ambulatory Visit: Payer: Self-pay

## 2024-10-20 ENCOUNTER — Other Ambulatory Visit: Payer: Self-pay | Admitting: Nurse Practitioner

## 2024-10-20 DIAGNOSIS — F5101 Primary insomnia: Secondary | ICD-10-CM

## 2024-10-22 ENCOUNTER — Other Ambulatory Visit (HOSPITAL_COMMUNITY): Payer: Self-pay

## 2024-10-22 MED ORDER — DOXEPIN HCL 25 MG PO CAPS
25.0000 mg | ORAL_CAPSULE | Freq: Every evening | ORAL | 1 refills | Status: AC | PRN
  Filled 2024-10-22: qty 90, 90d supply, fill #0

## 2024-10-23 ENCOUNTER — Other Ambulatory Visit (HOSPITAL_COMMUNITY): Payer: Self-pay

## 2024-10-23 MED ORDER — DOXEPIN HCL 25 MG PO CAPS
25.0000 mg | ORAL_CAPSULE | Freq: Every evening | ORAL | 1 refills | Status: AC | PRN
  Filled 2024-10-23: qty 90, 90d supply, fill #0
